# Patient Record
Sex: Male | Born: 1995
Health system: Southern US, Community
[De-identification: ages and names within clinical notes are randomized; demographics above are authoritative.]

## PROBLEM LIST (undated history)

## (undated) DIAGNOSIS — G245 Blepharospasm: Secondary | ICD-10-CM

## (undated) DIAGNOSIS — E213 Hyperparathyroidism, unspecified: Secondary | ICD-10-CM

## (undated) DIAGNOSIS — R519 Headache, unspecified: Secondary | ICD-10-CM

## (undated) DIAGNOSIS — Z87442 Personal history of urinary calculi: Secondary | ICD-10-CM

## (undated) DIAGNOSIS — J45909 Unspecified asthma, uncomplicated: Secondary | ICD-10-CM

## (undated) DIAGNOSIS — R51 Headache: Secondary | ICD-10-CM

## (undated) HISTORY — DX: Blepharospasm: G24.5

## (undated) HISTORY — PX: OTHER SURGICAL HISTORY: SHX169

## (undated) HISTORY — PX: FRACTURE SURGERY: SHX138

---
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
## 2013-10-17 ENCOUNTER — Encounter: Payer: Self-pay | Admitting: Neurology

## 2013-10-17 ENCOUNTER — Ambulatory Visit (INDEPENDENT_AMBULATORY_CARE_PROVIDER_SITE_OTHER): Payer: 59 | Admitting: Neurology

## 2013-10-17 ENCOUNTER — Encounter (INDEPENDENT_AMBULATORY_CARE_PROVIDER_SITE_OTHER): Payer: Self-pay

## 2013-10-17 VITALS — BP 126/61 | HR 68 | Ht 71.0 in | Wt 184.0 lb

## 2013-10-17 DIAGNOSIS — R251 Tremor, unspecified: Secondary | ICD-10-CM

## 2013-10-17 DIAGNOSIS — R259 Unspecified abnormal involuntary movements: Secondary | ICD-10-CM
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
## 2013-10-18 ENCOUNTER — Telehealth: Payer: Self-pay

## 2013-10-18 LAB — COMPREHENSIVE METABOLIC PANEL
ALBUMIN: 4.4 g/dL (ref 3.5–5.5)
ALK PHOS: 87 IU/L (ref 39–117)
ALT: 34 IU/L (ref 0–44)
AST: 15 IU/L (ref 0–40)
Albumin/Globulin Ratio: 1.4 (ref 1.1–2.5)
BUN / CREAT RATIO: 18 (ref 8–19)
BUN: 13 mg/dL (ref 6–20)
CHLORIDE: 100 mmol/L (ref 97–108)
CO2: 26 mmol/L (ref 18–29)
Calcium: 10 mg/dL (ref 8.7–10.2)
Creatinine, Ser: 0.74 mg/dL — ABNORMAL LOW (ref 0.76–1.27)
GFR calc non Af Amer: 133 mL/min/{1.73_m2} (ref >59)
GFR, EST AFRICAN AMERICAN: 154 mL/min/{1.73_m2} (ref >59)
GLUCOSE: 84 mg/dL (ref 65–99)
Globulin, Total: 3.1 g/dL (ref 1.5–4.5)
POTASSIUM: 4.6 mmol/L (ref 3.5–5.2)
Sodium: 139 mmol/L (ref 134–144)
TOTAL PROTEIN: 7.5 g/dL (ref 6.0–8.5)

## 2013-10-18 LAB — CBC WITH DIFFERENTIAL
BASOS ABS: 0 10*3/uL (ref 0.0–0.2)
Basos: 0 %
EOS ABS: 0.5 10*3/uL — AB (ref 0.0–0.4)
Eos: 7 %
HCT: 48 % (ref 37.5–51.0)
Hemoglobin: 15.6 g/dL (ref 12.6–17.7)
IMMATURE GRANULOCYTES: 1 %
Immature Grans (Abs): 0.1 10*3/uL (ref 0.0–0.1)
LYMPHS: 24 %
Lymphocytes Absolute: 1.6 10*3/uL (ref 0.7–3.1)
MCH: 26.6 pg (ref 26.6–33.0)
MCHC: 32.5 g/dL (ref 31.5–35.7)
MCV: 82 fL (ref 79–97)
MONOCYTES: 8 %
Monocytes Absolute: 0.5 10*3/uL (ref 0.1–0.9)
Neutrophils Absolute: 4 10*3/uL (ref 1.4–7.0)
Neutrophils Relative %: 60 %
PLATELETS: 317 10*3/uL (ref 150–379)
RBC: 5.86 x10E6/uL — ABNORMAL HIGH (ref 4.14–5.80)
RDW: 14.2 % (ref 12.3–15.4)
WBC: 6.7 10*3/uL (ref 3.4–10.8)

## 2013-10-18 LAB — VITAMIN B12: VITAMIN B 12: 415 pg/mL (ref 211–946)

## 2013-10-18 LAB — THYROID PANEL WITH TSH
Free Thyroxine Index: 2.4 (ref 1.2–4.9)
T3 Uptake Ratio: 30 % (ref 24–39)
T4 TOTAL: 8.1 ug/dL (ref 4.5–12.0)
TSH: 1.6 u[IU]/mL (ref 0.450–4.500)

## 2013-10-18 LAB — RPR: SYPHILIS RPR SCR: NONREACTIVE

## 2013-10-18 NOTE — Progress Notes (Signed)
Quick Note:  Called and left message for patient normals labs. ______

## 2013-10-18 NOTE — Telephone Encounter (Signed)
Called and left patient a message normal labs. 

## 2013-10-24 ENCOUNTER — Ambulatory Visit (INDEPENDENT_AMBULATORY_CARE_PROVIDER_SITE_OTHER): Payer: 59

## 2013-10-24 ENCOUNTER — Other Ambulatory Visit: Payer: 59

## 2013-10-24 DIAGNOSIS — R259 Unspecified abnormal involuntary movements: Secondary | ICD-10-CM

## 2013-10-24 DIAGNOSIS — R251 Tremor, unspecified: Secondary | ICD-10-CM

## 2013-10-26 NOTE — Progress Notes (Signed)
Quick Note:  Called and left message for patient normal MRI ______

## 2013-11-13 ENCOUNTER — Telehealth: Payer: Self-pay | Admitting: Neurology

## 2013-11-13 NOTE — Telephone Encounter (Signed)
Patient's father calling for MRI results.  Please call and advise.

## 2013-11-13 NOTE — Telephone Encounter (Signed)
Samuel Blankenship: Please call patient, MRI of the brain is normal

## 2013-11-14 NOTE — Telephone Encounter (Signed)
Called and left message told patient normal stated if he has any questions or concerns please give us a call.

## 2013-12-19 ENCOUNTER — Telehealth: Payer: Self-pay | Admitting: *Deleted

## 2013-12-19 NOTE — Telephone Encounter (Signed)
Patient requested copy of Mri Disc of brain and report at front desk 12-19-13.

## 2015-10-04 DIAGNOSIS — S02102A Fracture of base of skull, left side, initial encounter for closed fracture: Secondary | ICD-10-CM | POA: Diagnosis not present

## 2015-10-04 DIAGNOSIS — R109 Unspecified abdominal pain: Secondary | ICD-10-CM | POA: Diagnosis not present

## 2015-10-04 DIAGNOSIS — R079 Chest pain, unspecified: Secondary | ICD-10-CM | POA: Diagnosis not present

## 2015-10-04 DIAGNOSIS — S069X9A Unspecified intracranial injury with loss of consciousness of unspecified duration, initial encounter: Secondary | ICD-10-CM | POA: Diagnosis not present

## 2015-10-04 DIAGNOSIS — S020XXA Fracture of vault of skull, initial encounter for closed fracture: Secondary | ICD-10-CM | POA: Diagnosis not present

## 2015-10-04 DIAGNOSIS — Z043 Encounter for examination and observation following other accident: Secondary | ICD-10-CM | POA: Diagnosis not present

## 2015-10-04 DIAGNOSIS — Z888 Allergy status to other drugs, medicaments and biological substances status: Secondary | ICD-10-CM | POA: Diagnosis not present

## 2015-10-04 DIAGNOSIS — R51 Headache: Secondary | ICD-10-CM | POA: Diagnosis not present

## 2015-10-04 DIAGNOSIS — M25512 Pain in left shoulder: Secondary | ICD-10-CM | POA: Diagnosis not present

## 2015-10-04 DIAGNOSIS — Z880 Allergy status to penicillin: Secondary | ICD-10-CM | POA: Diagnosis not present

## 2015-10-04 DIAGNOSIS — S0219XA Other fracture of base of skull, initial encounter for closed fracture: Secondary | ICD-10-CM | POA: Diagnosis not present

## 2015-10-04 DIAGNOSIS — S51811A Laceration without foreign body of right forearm, initial encounter: Secondary | ICD-10-CM | POA: Diagnosis not present

## 2015-10-04 DIAGNOSIS — F17290 Nicotine dependence, other tobacco product, uncomplicated: Secondary | ICD-10-CM | POA: Diagnosis not present

## 2015-10-04 DIAGNOSIS — S50311A Abrasion of right elbow, initial encounter: Secondary | ICD-10-CM | POA: Diagnosis not present

## 2015-10-04 DIAGNOSIS — S065X0A Traumatic subdural hemorrhage without loss of consciousness, initial encounter: Secondary | ICD-10-CM | POA: Diagnosis not present

## 2015-10-04 DIAGNOSIS — I6200 Nontraumatic subdural hemorrhage, unspecified: Secondary | ICD-10-CM | POA: Diagnosis not present

## 2015-10-04 DIAGNOSIS — X58XXXA Exposure to other specified factors, initial encounter: Secondary | ICD-10-CM | POA: Diagnosis not present

## 2015-10-04 DIAGNOSIS — S82201A Unspecified fracture of shaft of right tibia, initial encounter for closed fracture: Secondary | ICD-10-CM | POA: Diagnosis not present

## 2015-10-04 DIAGNOSIS — S0081XA Abrasion of other part of head, initial encounter: Secondary | ICD-10-CM | POA: Diagnosis not present

## 2015-10-04 DIAGNOSIS — S82231A Displaced oblique fracture of shaft of right tibia, initial encounter for closed fracture: Secondary | ICD-10-CM | POA: Diagnosis not present

## 2015-10-04 DIAGNOSIS — S40211A Abrasion of right shoulder, initial encounter: Secondary | ICD-10-CM | POA: Diagnosis not present

## 2015-10-04 DIAGNOSIS — S82191A Other fracture of upper end of right tibia, initial encounter for closed fracture: Secondary | ICD-10-CM | POA: Diagnosis not present

## 2015-10-14 ENCOUNTER — Encounter: Payer: Self-pay | Admitting: Podiatry

## 2015-10-14 ENCOUNTER — Ambulatory Visit (HOSPITAL_BASED_OUTPATIENT_CLINIC_OR_DEPARTMENT_OTHER)
Admission: RE | Admit: 2015-10-14 | Discharge: 2015-10-14 | Disposition: A | Discharge status: Home / Self Care | Payer: 59 | Source: Ambulatory Visit | Attending: Podiatry | Admitting: Podiatry

## 2015-10-14 ENCOUNTER — Ambulatory Visit (INDEPENDENT_AMBULATORY_CARE_PROVIDER_SITE_OTHER): Payer: 59 | Admitting: Podiatry

## 2015-10-14 DIAGNOSIS — M779 Enthesopathy, unspecified: Secondary | ICD-10-CM

## 2015-10-14 DIAGNOSIS — R609 Edema, unspecified: Secondary | ICD-10-CM

## 2015-10-14 DIAGNOSIS — R52 Pain, unspecified: Secondary | ICD-10-CM | POA: Diagnosis not present
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
## 2015-10-21 DIAGNOSIS — S065X0A Traumatic subdural hemorrhage without loss of consciousness, initial encounter: Secondary | ICD-10-CM | POA: Diagnosis not present

## 2015-10-24 ENCOUNTER — Ambulatory Visit (INDEPENDENT_AMBULATORY_CARE_PROVIDER_SITE_OTHER): Payer: 59 | Admitting: Podiatry

## 2015-10-24 ENCOUNTER — Encounter: Payer: Self-pay | Admitting: Podiatry

## 2015-10-24 DIAGNOSIS — R609 Edema, unspecified: Secondary | ICD-10-CM | POA: Diagnosis not present

## 2015-10-27 DIAGNOSIS — M79661 Pain in right lower leg: Secondary | ICD-10-CM | POA: Diagnosis not present

## 2015-10-27 DIAGNOSIS — M25521 Pain in right elbow: Secondary | ICD-10-CM | POA: Diagnosis not present

## 2015-10-27 MED FILL — HYDROCODON-APAP 5-325: 5-325 | 13 days supply | Qty: 40 | Fill #0

## 2015-10-28 ENCOUNTER — Ambulatory Visit: Payer: 59 | Attending: Orthopedic Surgery | Admitting: Physical Therapy

## 2015-10-28 DIAGNOSIS — R600 Localized edema: Secondary | ICD-10-CM | POA: Insufficient documentation

## 2015-10-28 DIAGNOSIS — R262 Difficulty in walking, not elsewhere classified: Secondary | ICD-10-CM | POA: Insufficient documentation

## 2015-10-28 DIAGNOSIS — M25671 Stiffness of right ankle, not elsewhere classified: Secondary | ICD-10-CM | POA: Insufficient documentation

## 2015-10-28 DIAGNOSIS — M6281 Muscle weakness (generalized): Secondary | ICD-10-CM | POA: Diagnosis not present

## 2015-10-28 DIAGNOSIS — M25661 Stiffness of right knee, not elsewhere classified: Secondary | ICD-10-CM | POA: Insufficient documentation
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
## 2015-10-29 ENCOUNTER — Ambulatory Visit: Payer: 59 | Admitting: Physical Therapy

## 2015-10-29 DIAGNOSIS — R600 Localized edema: Secondary | ICD-10-CM

## 2015-10-29 DIAGNOSIS — M25661 Stiffness of right knee, not elsewhere classified: Secondary | ICD-10-CM

## 2015-10-29 DIAGNOSIS — M6281 Muscle weakness (generalized): Secondary | ICD-10-CM

## 2015-10-29 DIAGNOSIS — M25671 Stiffness of right ankle, not elsewhere classified: Secondary | ICD-10-CM | POA: Diagnosis not present

## 2015-10-29 DIAGNOSIS — R262 Difficulty in walking, not elsewhere classified: Secondary | ICD-10-CM

## 2015-10-29 NOTE — Therapy (Signed)
[facility] [address] [place], [place], [address] Phone: [phone]   Fax:  [phone]  Physical Therapy Treatment  Patient Details  Name: Samuel Blankenship MRN: [id number] Date of Birth: 02-08-1995 Referring Provider: Eulah Pont   Encounter Date: 10/29/2015      PT End of Session - 10/29/15 1820    Visit Number 2   Number of Visits 24   Date for PT Re-Evaluation 12/23/15   PT Start Time 1503   PT Stop Time 1547   PT Time Calculation (min) 44 min   Activity Tolerance Patient tolerated treatment well   Behavior During Therapy [facility] for tasks assessed/performed      Past Medical History:  Diagnosis Date  . Asthma   . Eye twitch     Past Surgical History:  Procedure Laterality Date  . None      There were no vitals filed for this visit.      Subjective Assessment - 10/29/15 1522    Subjective 1-2/10 pain with walking   Currently in Pain? Yes   Pain Score 0-No pain   Pain Location Ankle   Pain Orientation Right   Pain Descriptors / Indicators Sore   Pain Frequency Intermittent   Aggravating Factors  weightbearing   Pain Relieving Factors rest   Multiple Pain Sites Yes   Pain Score 2   Pain Location Knee   Pain Orientation Right;Posterior;Anterior   Pain Descriptors / Indicators Aching;Sharp;Stabbing   Pain Frequency Intermittent   Aggravating Factors  weightbearing longer, stretches   Pain Relieving Factors Tylenol, vicoden, heat            [facility] PT Assessment - 10/29/15 0001      AROM   Right Knee Extension -7   Right Knee Flexion 110                     [facility] Adult PT Treatment/Exercise - 10/29/15 0001      Ambulation/Gait   Ambulation/Gait Yes   Assistive device --  1 crutch   Ambulation Surface Level;Indoor  cues   Gait Comments Patient will try to borrow a crutch from a friend. Cues to aviod wet floors.  Not ready for full time crutch yet.  use walker .     Knee/Hip  Exercises: Stretches   Gastroc Stretch 3 reps;30 seconds  1 leg incline board   Other Knee/Hip Stretches prostretch 10 X 5 second holds sitting     Knee/Hip Exercises: Seated   Long Arc Quad 1 set;10 reps   Heel Slides 1 set;10 reps  cues, foot on pillowcase     Knee/Hip Exercises: Supine   Short Arc Quad Sets 1 set;10 reps   Heel Slides 1 set;10 reps   Straight Leg Raises 1 set;10 reps  cues     Manual Therapy   Manual therapy comments PROM ankle, toes,  AA ROM knee     Ankle Exercises: Supine   Isometrics 3 way 5 X 5 sesonds   Other Supine Ankle Exercises PF/DF AROM                PT Education - 10/29/15 1820    Education provided Yes   Education Details gait   Person(s) Educated Patient   Methods Explanation;Tactile cues;Verbal cues;Demonstration   Comprehension Verbalized understanding;Returned demonstration          PT Short Term Goals - 10/28/15 1416      PT SHORT TERM GOAL #1  Title Pt will be I with initial HEP for Rt. LE    Time 4   Period Weeks   Status New     PT SHORT TERM GOAL #2   Title Pt will be able to walk in community with 1 crutch and min increase in pain.    Time 4   Period Weeks   Status New     PT SHORT TERM GOAL #3   Title Pt will complete FOTO and set goal.    Time 4   Period Weeks   Status New     PT SHORT TERM GOAL #4   Title Pt will increase AROM in Rt. knee flexion to 115 deg    Time 4   Period Weeks   Status New     PT SHORT TERM GOAL #5   Title Pt will demo R ankle strength to 4/5 throughout or greater to improve gait stability   Time 4   Period Weeks   Status New           PT Long Term Goals - 10/28/15 1418      PT LONG TERM GOAL #1   Title Pt will be I with concepts of RICE, more advanced LE HEP.    Time 8   Period Weeks     PT LONG TERM GOAL #2   Title Pt will demo Rt. knee AROM to -3 to 120 deg or better for transfers and normalized gait.    Time 8   Period Weeks   Status New     PT LONG  TERM GOAL #3   Title Pt will demo 4+/5 strength in Rt. LE in major muscle groups for normalized gait.    Time 8   Period Weeks   Status New     PT LONG TERM GOAL #4   Title Pt will walk as needed in the community without pain, min limp and without device.    Time 8   Period Weeks   Status New     PT LONG TERM GOAL #5   Title FOTO score will be set once complete.    Time 8   Period Weeks   Status New               Plan - 10/29/15 1820    Clinical Impression Statement Patient was able to demo safe gait with 1 crutch.  Exercises for ROM and strengthening knee/ankle continued.  Knee ROM improving 110 degrees.  -7 extension.   PT Next Visit Plan gait , ROM  ,  standing step stretches for ankle and knee.     PT Home Exercise Plan continue   Consulted and Agree with Plan of Care Patient      Patient will benefit from skilled therapeutic intervention in order to improve the following deficits and impairments:  Abnormal gait, Decreased range of motion, Difficulty walking, Increased fascial restricitons, Decreased activity tolerance, Pain, Impaired flexibility, Decreased scar mobility, Decreased balance, Decreased mobility, Decreased strength, Increased edema, Impaired sensation, Postural dysfunction  Visit Diagnosis: Difficulty in walking, not elsewhere classified  Localized edema  Stiffness of right knee, not elsewhere classified  Stiffness of right ankle, not elsewhere classified  Muscle weakness (generalized)     Problem List Patient Active Problem List   Diagnosis Date Noted  . Tremor 10/17/2013    Gelila Well  PTA 10/29/2015, 6:24 PM  [facility] [address] [place], [place], [address] Phone: [phone]  Fax:  [phone]  Name: Samuel Blankenship MRN: [id number] Date of Birth: 02-03-1995

## 2015-10-30 ENCOUNTER — Ambulatory Visit: Payer: 59 | Admitting: Physical Therapy

## 2015-10-30 DIAGNOSIS — M6281 Muscle weakness (generalized): Secondary | ICD-10-CM | POA: Diagnosis not present

## 2015-10-30 DIAGNOSIS — M25661 Stiffness of right knee, not elsewhere classified: Secondary | ICD-10-CM

## 2015-10-30 DIAGNOSIS — R262 Difficulty in walking, not elsewhere classified: Secondary | ICD-10-CM

## 2015-10-30 DIAGNOSIS — R600 Localized edema: Secondary | ICD-10-CM

## 2015-10-30 DIAGNOSIS — M25671 Stiffness of right ankle, not elsewhere classified: Secondary | ICD-10-CM

## 2015-10-30 NOTE — Therapy (Signed)
[facility] [address] [place], [place], [address] Phone: [phone]   Fax:  [phone]  Physical Therapy Treatment  Patient Details  Name: Samuel Blankenship MRN: [id number] Date of Birth: [birth year] Referring Provider: Eulah PontMurphy   Encounter Date: 10/30/2015      PT End of Session - 10/30/15 1627    Visit Number 3   Number of Visits 24   Date for PT Re-Evaluation 12/23/15   PT Start Time 0345   PT Stop Time 0450   PT Time Calculation (min) 65 min      Past Medical History:  Diagnosis Date  . Asthma   . Eye twitch     Past Surgical History:  Procedure Laterality Date  . None      There were no vitals filed for this visit.      Subjective Assessment - 10/30/15 1549    Subjective More soreness around the knee.    Currently in Pain? Yes   Pain Score 2    Pain Location Knee            [facility] PT Assessment - 10/30/15 0001      AROM   Right Knee Flexion 115                     [facility] Adult PT Treatment/Exercise - 10/30/15 0001      Knee/Hip Exercises: Aerobic   Recumbent Bike 5 minutes full revolutions and level 1      Knee/Hip Exercises: Standing   Other Standing Knee Exercises standing step stretch for knee and ankle 3 x 30 sec      Knee/Hip Exercises: Seated   Long Arc Quad 20 reps     Knee/Hip Exercises: Supine   Short Arc Quad Sets 20 reps   Heel Slides 20 reps   Bridges 10 reps   Bridges Limitations also 10 on ball   Straight Leg Raises 15 reps     Knee/Hip Exercises: Sidelying   Hip ABduction 15 reps     Knee/Hip Exercises: Prone   Hamstring Curl Limitations 1 rep very difficult    Hip Extension Limitations unable      Modalities   Modalities Cryotherapy     Cryotherapy   Number Minutes Cryotherapy 10 Minutes   Cryotherapy Location Knee   Type of Cryotherapy Ice pack     Ankle Exercises: Seated   Heel Raises 15 reps   Toe Raise 15 reps     Ankle Exercises: Supine    T-Band red band seated inversion and eversion                 PT Education - 10/30/15 1637    Education provided Yes   Education Details Ankle 4 way red band   Person(s) Educated Patient   Methods Explanation;Handout   Comprehension Verbalized understanding          PT Short Term Goals - 10/28/15 1416      PT SHORT TERM GOAL #1   Title Pt will be I with initial HEP for Rt. LE    Time 4   Period Weeks   Status New     PT SHORT TERM GOAL #2   Title Pt will be able to walk in community with 1 crutch and min increase in pain.    Time 4   Period Weeks   Status New     PT SHORT TERM GOAL #3   Title Pt will complete  FOTO and set goal.    Time 4   Period Weeks   Status New     PT SHORT TERM GOAL #4   Title Pt will increase AROM in Rt. knee flexion to 115 deg    Time 4   Period Weeks   Status New     PT SHORT TERM GOAL #5   Title Pt will demo R ankle strength to 4/5 throughout or greater to improve gait stability   Time 4   Period Weeks   Status New           PT Long Term Goals - 10/28/15 1418      PT LONG TERM GOAL #1   Title Pt will be I with concepts of RICE, more advanced LE HEP.    Time 8   Period Weeks     PT LONG TERM GOAL #2   Title Pt will demo Rt. knee AROM to -3 to 120 deg or better for transfers and normalized gait.    Time 8   Period Weeks   Status New     PT LONG TERM GOAL #3   Title Pt will demo 4+/5 strength in Rt. LE in major muscle groups for normalized gait.    Time 8   Period Weeks   Status New     PT LONG TERM GOAL #4   Title Pt will walk as needed in the community without pain, min limp and without device.    Time 8   Period Weeks   Status New     PT LONG TERM GOAL #5   Title FOTO score will be set once complete.    Time 8   Period Weeks   Status New               Plan - 10/30/15 1638    Clinical Impression Statement Improving ROM, increased soreness with transition to 1 crutch. Recommended more ice  throughout the day.    PT Next Visit Plan gait , ROM  , standing step stretches for ankle and knee.  REVIEW 4 way ANKLE, bike      Patient will benefit from skilled therapeutic intervention in order to improve the following deficits and impairments:  Abnormal gait, Decreased range of motion, Difficulty walking, Increased fascial restricitons, Decreased activity tolerance, Pain, Impaired flexibility, Decreased scar mobility, Decreased balance, Decreased mobility, Decreased strength, Increased edema, Impaired sensation, Postural dysfunction  Visit Diagnosis: Difficulty in walking, not elsewhere classified  Localized edema  Stiffness of right knee, not elsewhere classified  Stiffness of right ankle, not elsewhere classified  Muscle weakness (generalized)     Problem List Patient Active Problem List   Diagnosis Date Noted  . Tremor 10/17/2013    Samuel Blankenship , PTA [facility] [address] [place], [place], [address] Phone: [phone]   Fax:  [phone]  Name: Samuel Blankenship MRN: [id number] Date of Birth: January 20, 1996
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
## 2015-11-03 ENCOUNTER — Ambulatory Visit: Payer: 59 | Admitting: Physical Therapy

## 2015-11-05 ENCOUNTER — Ambulatory Visit: Payer: 59 | Attending: Orthopedic Surgery | Admitting: Physical Therapy

## 2015-11-05 DIAGNOSIS — M25671 Stiffness of right ankle, not elsewhere classified: Secondary | ICD-10-CM | POA: Insufficient documentation

## 2015-11-05 DIAGNOSIS — R600 Localized edema: Secondary | ICD-10-CM | POA: Diagnosis not present

## 2015-11-05 DIAGNOSIS — M25661 Stiffness of right knee, not elsewhere classified: Secondary | ICD-10-CM | POA: Insufficient documentation

## 2015-11-05 DIAGNOSIS — R262 Difficulty in walking, not elsewhere classified: Secondary | ICD-10-CM | POA: Diagnosis not present

## 2015-11-05 DIAGNOSIS — M6281 Muscle weakness (generalized): Secondary | ICD-10-CM | POA: Diagnosis not present

## 2015-11-05 NOTE — Therapy (Signed)
[facility] [address] [place], [place], [address] Phone: [phone]   Fax:  [phone]  Physical Therapy Treatment  Patient Details  Name: Samuel Blankenship MRN: [id number] Date of Birth: 1995/12/02 Referring Provider: Eulah PontMurphy   Encounter Date: 11/05/2015      PT End of Session - 11/05/15 1527    Visit Number 4   Number of Visits 24   Date for PT Re-Evaluation 12/23/15   PT Start Time 1442   PT Stop Time 1536   PT Time Calculation (min) 54 min   Activity Tolerance Patient tolerated treatment well   Behavior During Therapy [facility] for tasks assessed/performed      Past Medical History:  Diagnosis Date  . Asthma   . Eye twitch     Past Surgical History:  Procedure Laterality Date  . None      There were no vitals filed for this visit.      Subjective Assessment - 11/05/15 1444    Subjective Had some shin pain earlier, mild in patella.  Rated 2/10. Wants to work on hamstring strength.     Currently in Pain? Yes   Pain Score 2    Pain Location Knee   Pain Orientation Right   Pain Descriptors / Indicators Sore   Pain Type Surgical pain   Pain Onset More than a month ago   Pain Frequency Intermittent            [facility] PT Assessment - 11/05/15 1524      AROM   Right Knee Flexion 124  supine              [facility] Adult PT Treatment/Exercise - 11/05/15 1446      Knee/Hip Exercises: Stretches   Active Hamstring Stretch Right;3 reps;30 seconds   Knee: Self-Stretch to increase Flexion Right;3 reps;30 seconds     Knee/Hip Exercises: Aerobic   Recumbent Bike 6 min level 3      Knee/Hip Exercises: Standing   Hip Extension Stengthening;Right;1 set;10 reps   Other Standing Knee Exercises hamstring curl standing Rt. LE x 10      Knee/Hip Exercises: Supine   Quad Sets Strengthening;Right;1 set;20 reps;Other (comment)   Quad Sets Limitations prone quad set  towel under thigh    Hip Adduction Isometric  Strengthening;Both;1 set   Bridges Limitations bridge with knee ext x 10 Rt. LE    Bridges with Beacher MayBall Squeeze Strengthening;Both;1 set;10 reps   Straight Leg Raises Strengthening;Right;1 set;10 reps   Straight Leg Raise with External Rotation Strengthening;Right;1 set;10 reps   Straight Leg Raise with External Rotation Limitations up on elbows      Knee/Hip Exercises: Prone   Hamstring Curl 2 sets;10 reps   Hip Extension Strengthening;Right;1 set;10 reps     Ankle Exercises: Stretches   Slant Board Stretch 3 reps;30 seconds  2 times knee ext, 2 times soleus, sl knee flex.                 PT Education - 11/05/15 1527    Education provided Yes   Education Details progress   Person(s) Educated Patient   Methods Explanation;Demonstration   Comprehension Verbalized understanding;Returned demonstration          PT Short Term Goals - 11/05/15 1514      PT SHORT TERM GOAL #1   Title Pt will be I with initial HEP for Rt. LE    Status Achieved     PT SHORT TERM GOAL #2  Title Pt will be able to walk in community with 1 crutch and min increase in pain.    Status Achieved     PT SHORT TERM GOAL #3   Title Pt will complete FOTO and set goal.    Status Achieved     PT SHORT TERM GOAL #4   Title Pt will increase AROM in Rt. knee flexion to 115 deg    Status Achieved     PT SHORT TERM GOAL #5   Title Pt will demo R ankle strength to 4/5 throughout or greater to improve gait stability   Status On-going           PT Long Term Goals - 11/05/15 1515      PT LONG TERM GOAL #1   Title Pt will be I with concepts of RICE, more advanced LE HEP.    Status On-going     PT LONG TERM GOAL #2   Title Pt will demo Rt. knee AROM to -3 to 120 deg or better for transfers and normalized gait.    Status On-going     PT LONG TERM GOAL #3   Title Pt will demo 4+/5 strength in Rt. LE in major muscle groups for normalized gait.    Status Unable to assess     PT LONG TERM GOAL #4    Title Pt will walk as needed in the community without pain, min limp and without device.    Status On-going     PT LONG TERM GOAL #5   Title FOTO score will be 37% impaired or less to demo functional improvement. .     Status On-going               Plan - 11/05/15 1529    Clinical Impression Statement Pt still improving gait and AROM.  Pt did not wear the ace wrap to see if he can go without.  Sees Neurologist for SDH follow up next week.  I with HEP.    PT Next Visit Plan cont strength of Rt. LE, progress ROM.  slowly work into standing ex as tolerated. Bike, ice    PT Home Exercise Plan I with Initial  HEP    Consulted and Agree with Plan of Care Patient      Patient will benefit from skilled therapeutic intervention in order to improve the following deficits and impairments:  Abnormal gait, Decreased range of motion, Difficulty walking, Increased fascial restricitons, Decreased activity tolerance, Pain, Impaired flexibility, Decreased scar mobility, Decreased balance, Decreased mobility, Decreased strength, Increased edema, Impaired sensation, Postural dysfunction  Visit Diagnosis: Difficulty in walking, not elsewhere classified  Localized edema  Stiffness of right knee, not elsewhere classified  Stiffness of right ankle, not elsewhere classified  Muscle weakness (generalized)     Problem List Patient Active Problem List   Diagnosis Date Noted  . Tremor 10/17/2013    Ronda Kazmi 11/05/2015, 3:33 PM  [facility] [address] [place], [place], [address] Phone: [phone]   Fax:  [phone]  Name: Samuel Blankenship MRN: [id number] Date of Birth: Oct 14, 1995  Karie Mainland, PT 11/05/15 3:35 PM Phone: [phone] Fax: [phone]

## 2015-11-07 ENCOUNTER — Ambulatory Visit: Payer: 59 | Admitting: Physical Therapy

## 2015-11-07 DIAGNOSIS — M25661 Stiffness of right knee, not elsewhere classified: Secondary | ICD-10-CM | POA: Diagnosis not present

## 2015-11-07 DIAGNOSIS — R262 Difficulty in walking, not elsewhere classified: Secondary | ICD-10-CM

## 2015-11-07 DIAGNOSIS — M25671 Stiffness of right ankle, not elsewhere classified: Secondary | ICD-10-CM | POA: Diagnosis not present

## 2015-11-07 DIAGNOSIS — R600 Localized edema: Secondary | ICD-10-CM | POA: Diagnosis not present

## 2015-11-07 DIAGNOSIS — M6281 Muscle weakness (generalized): Secondary | ICD-10-CM

## 2015-11-07 DIAGNOSIS — S82231A Displaced oblique fracture of shaft of right tibia, initial encounter for closed fracture: Secondary | ICD-10-CM | POA: Diagnosis not present

## 2015-11-07 NOTE — Patient Instructions (Addendum)
Bracing With Bridging (Hook-Lying)    With neutral spine, tighten pelvic floor and abdominals and hold. Lift bottom, PUSH HEELS INTO THE FLOOR. Repeat _10__ times. Do _2__ times a day.   Copyright  VHI. All rights reserved.  Back Wall Slide    With feet __10-12__ inches from wall, lean as much of back against the wall as possible. Gently squat down _1/4 to 1/3 of the way __ inches, keeping back against wall. Hold __5-10__ seconds while counting out loud. Repeat _10___ times. Do __1-2__ sessions per day.  http://gt2.exer.us/563   Copyright  VHI. All rights reserved.    Heel Raise: Bilateral (Standing)    Rise on balls of feet. Repeat __20__ times per set. Do _1___ sets per session. Do __1-2_ sessions per day.  http://orth.exer.us/38   Copyright  VHI. All rights reserved.

## 2015-11-07 NOTE — Therapy (Signed)
[facility] [address] [place], [place], [address] Phone: [phone]   Fax:  [phone]  Physical Therapy Treatment  Patient Details  Name: Samuel LeiterDavid S Dykes MRN: [id number] Date of Birth: 11/15/1995 Referring Provider: Eulah PontMurphy   Encounter Date: 11/07/2015      PT End of Session - 11/07/15 1015    Visit Number 5   Number of Visits 24   Date for PT Re-Evaluation 12/23/15   PT Start Time 0936   PT Stop Time 1024   PT Time Calculation (min) 48 min   Activity Tolerance Patient tolerated treatment well   Behavior During Therapy [facility] for tasks assessed/performed      Past Medical History:  Diagnosis Date  . Asthma   . Eye twitch     Past Surgical History:  Procedure Laterality Date  . None      There were no vitals filed for this visit.      Subjective Assessment - 11/07/15 0943    Subjective No new complaints.  Stiff today, but no pain, really.    Currently in Pain? No/denies            [facility] - 11/07/15 0948      Knee/Hip Exercises: Aerobic   Recumbent Bike 6 min level 3      Knee/Hip Exercises: Standing   Heel Raises Left;1 set;10 reps   Heel Raises Limitations light U assist    Forward Lunges Left;1 set   Forward Lunges Limitations 10 sec hold x 10 in parallel bars    Abduction Limitations semicircle towel slides    Forward Step Up Left;1 set;Hand Hold: 2;Step Height: 4"   Forward Step Up Limitations has to "hop", decreased strength    SLS with Vectors semicircles x 10 each leg    Other Standing Knee Exercises gait in parallel bars with occasional UE assist      Knee/Hip Exercises: Seated   Long Arc Quad Strengthening;Right;1 set;20 reps;Weights   Long Arc Quad Weight 3 lbs.     Knee/Hip Exercises: Supine   Quad Sets Strengthening;Right;1 set;20 reps;Other (comment)   Heel Slides 20 reps   Heel Slides Limitations changed to hamstring set    Bridges  Strengthening;Both;1 set;10 reps   Bridges Limitations ball squeeze     Knee/Hip Exercises: Sidelying   Hip ABduction 15 reps   Clams 15x     Modalities   Modalities Cryotherapy     Cryotherapy   Number Minutes Cryotherapy 10 Minutes   Cryotherapy Location Knee   Type of Cryotherapy Ice pack     Ankle Exercises: Supine   T-Band red band seated inversion and eversion      took ice off 2 min early due to stinging.            PT Education - 11/07/15 1015    Education provided Yes   Education Details closed chain vs open    Person(s) Educated Patient   Methods Explanation   Comprehension Verbalized understanding          PT Short Term Goals - 11/05/15 1514      PT SHORT TERM GOAL #1   Title Pt will be I with initial HEP for Rt. LE    Status Achieved     PT SHORT TERM GOAL #2   Title Pt will be able to walk in community with 1 crutch and min increase in pain.    Status Achieved     PT  SHORT TERM GOAL #3   Title Pt will complete FOTO and set goal.    Status Achieved     PT SHORT TERM GOAL #4   Title Pt will increase AROM in Rt. knee flexion to 115 deg    Status Achieved     PT SHORT TERM GOAL #5   Title Pt will demo R ankle strength to 4/5 throughout or greater to improve gait stability   Status On-going           PT Long Term Goals - 11/05/15 1515      PT LONG TERM GOAL #1   Title Pt will be I with concepts of RICE, more advanced LE HEP.    Status On-going     PT LONG TERM GOAL #2   Title Pt will demo Rt. knee AROM to -3 to 120 deg or better for transfers and normalized gait.    Status On-going     PT LONG TERM GOAL #3   Title Pt will demo 4+/5 strength in Rt. LE in major muscle groups for normalized gait.    Status Unable to assess     PT LONG TERM GOAL #4   Title Pt will walk as needed in the community without pain, min limp and without device.    Status On-going     PT LONG TERM GOAL #5   Title FOTO score will be 37% impaired or less  to demo functional improvement. .     Status On-going               Plan - 11/07/15 1037    Clinical Impression Statement Pt probably ready for cane next week.  Progressing well, pain incr with standing, weakness evident.    PT Next Visit Plan cont strength of Rt. LE, progress ROM.  slowly work into standing ex as tolerated. Bike, ice    PT Home Exercise Plan I with Initial  HEP    Consulted and Agree with Plan of Care Patient      Patient will benefit from skilled therapeutic intervention in order to improve the following deficits and impairments:  Abnormal gait, Decreased range of motion, Difficulty walking, Increased fascial restricitons, Decreased activity tolerance, Pain, Impaired flexibility, Decreased scar mobility, Decreased balance, Decreased mobility, Decreased strength, Increased edema, Impaired sensation, Postural dysfunction  Visit Diagnosis: Difficulty in walking, not elsewhere classified  Localized edema  Stiffness of right knee, not elsewhere classified  Stiffness of right ankle, not elsewhere classified  Muscle weakness (generalized)     Problem List Patient Active Problem List   Diagnosis Date Noted  . Tremor 10/17/2013    Keren Alverio 11/07/2015, 10:58 AM  [facility] [address] [place], [place], [address] Phone: [phone]   Fax:  [phone]  Name: ZAYYAN MULLEN MRN: [id number] Date of Birth: 26-Jun-1995  Karie Mainland, PT 11/07/15 10:58 AM Phone: [phone] Fax: [phone]

## 2015-11-10 ENCOUNTER — Ambulatory Visit: Payer: 59 | Admitting: Physical Therapy

## 2015-11-10 DIAGNOSIS — M25671 Stiffness of right ankle, not elsewhere classified: Secondary | ICD-10-CM | POA: Diagnosis not present

## 2015-11-10 DIAGNOSIS — M25661 Stiffness of right knee, not elsewhere classified: Secondary | ICD-10-CM | POA: Diagnosis not present

## 2015-11-10 DIAGNOSIS — R262 Difficulty in walking, not elsewhere classified: Secondary | ICD-10-CM

## 2015-11-10 DIAGNOSIS — M6281 Muscle weakness (generalized): Secondary | ICD-10-CM | POA: Diagnosis not present

## 2015-11-10 DIAGNOSIS — R600 Localized edema: Secondary | ICD-10-CM

## 2015-11-10 NOTE — Therapy (Signed)
[facility] [address] [place], [place], [address] Phone: [phone]   Fax:  [phone]  Physical Therapy Treatment  Patient Details  Name: Samuel Blankenship MRN: [id number] Date of Birth: 1995/10/22 Referring Provider: Eulah PontMurphy   Encounter Date: 11/10/2015      PT End of Session - 11/10/15 1022    Visit Number 6   Number of Visits 24   Date for PT Re-Evaluation 12/23/15   PT Start Time 1017   PT Stop Time 1100   PT Time Calculation (min) 43 min      Past Medical History:  Diagnosis Date  . Asthma   . Eye twitch     Past Surgical History:  Procedure Laterality Date  . None      There were no vitals filed for this visit.      Subjective Assessment - 11/10/15 1021    Currently in Pain? Yes   Pain Score 2    Pain Location Knee   Pain Orientation Right   Aggravating Factors  walking, after prolonged positions then moving   Pain Relieving Factors rest                         [facility] Adult PT Treatment/Exercise - 11/10/15 0001      Knee/Hip Exercises: Aerobic   Recumbent Bike 5 min level 3      Knee/Hip Exercises: Standing   Heel Raises Left;1 set;10 reps   Heel Raises Limitations light U assist 5 sec holds with weight shifting to the right, c/ o knee popping   Knee Flexion 5 reps   Knee Flexion Limitations painful so disc   Other Standing Knee Exercises gait in parallel bars with occasional UE assist, pregait roll thu heel strike, toe off then gait training with 1 crutch to decrease antalgic gait.      Knee/Hip Exercises: Seated   Long Arc Quad 1 set;10 reps   Long Arc Quad Weight --  yellow band    Hamstring Curl 1 set;10 reps   Hamstring Limitations yellow band, less pain       Knee/Hip Exercises: Supine   Bridges Strengthening;Both;1 set;10 reps   Straight Leg Raises Strengthening;Right;1 set;15 reps                PT Education - 11/10/15 1117    Education provided Yes    Education Details HEP   Person(s) Educated Patient   Methods Explanation;Handout   Comprehension Verbalized understanding          PT Short Term Goals - 11/05/15 1514      PT SHORT TERM GOAL #1   Title Pt will be I with initial HEP for Rt. LE    Status Achieved     PT SHORT TERM GOAL #2   Title Pt will be able to walk in community with 1 crutch and min increase in pain.    Status Achieved     PT SHORT TERM GOAL #3   Title Pt will complete FOTO and set goal.    Status Achieved     PT SHORT TERM GOAL #4   Title Pt will increase AROM in Rt. knee flexion to 115 deg    Status Achieved     PT SHORT TERM GOAL #5   Title Pt will demo R ankle strength to 4/5 throughout or greater to improve gait stability   Status On-going  PT Long Term Goals - 11/05/15 1515      PT LONG TERM GOAL #1   Title Pt will be I with concepts of RICE, more advanced LE HEP.    Status On-going     PT LONG TERM GOAL #2   Title Pt will demo Rt. knee AROM to -3 to 120 deg or better for transfers and normalized gait.    Status On-going     PT LONG TERM GOAL #3   Title Pt will demo 4+/5 strength in Rt. LE in major muscle groups for normalized gait.    Status Unable to assess     PT LONG TERM GOAL #4   Title Pt will walk as needed in the community without pain, min limp and without device.    Status On-going     PT LONG TERM GOAL #5   Title FOTO score will be 37% impaired or less to demo functional improvement. .     Status On-going               Plan - 11/10/15 1059    Clinical Impression Statement Focused decreasing antalgic gait with 1 crutch. Added yellow band LAQ and Hamstring curls to HEP. Hamstring curls painful in standing and prone. HE is able to do sitting with yellow band with mild pain.    PT Next Visit Plan cont strength of Rt. LE, progress ROM.  slowly work into standing ex as tolerated. Bike, ice , TRY CANE   PT Home Exercise Plan hamstring curls and LAQ with  yellow band   Consulted and Agree with Plan of Care Patient      Patient will benefit from skilled therapeutic intervention in order to improve the following deficits and impairments:  Abnormal gait, Decreased range of motion, Difficulty walking, Increased fascial restricitons, Decreased activity tolerance, Pain, Impaired flexibility, Decreased scar mobility, Decreased balance, Decreased mobility, Decreased strength, Increased edema, Impaired sensation, Postural dysfunction  Visit Diagnosis: Difficulty in walking, not elsewhere classified  Localized edema  Stiffness of right knee, not elsewhere classified  Stiffness of right ankle, not elsewhere classified  Muscle weakness (generalized)     Problem List Patient Active Problem List   Diagnosis Date Noted  . Tremor 10/17/2013    Sherrie Mustache, PTA 11/10/2015, 11:20 AM  [facility] [address] [place], [place], [address] Phone: [phone]   Fax:  [phone]  Name: Samuel Blankenship MRN: [id number] Date of Birth: 1995-03-31

## 2015-11-10 NOTE — Patient Instructions (Signed)
Knee Extension: Resisted (Sitting)   With band looped around right ankle and under other foot, straighten leg with ankle loop. Keep other leg bent to increase resistance. Repeat _10___ times per set. Do _2___ sets per session. Do __2__ sessions per day.  http://orth.[email or web address].[email or web address]   CKnee Flexion: Resisted (Sitting)   Sit with band under left foot and looped around ankle of supported leg. Pull unsupported leg back. Repeat __20__ times per set. Do _2___ sets per session. Do __2__ sessions per day.

## 2015-11-12 ENCOUNTER — Ambulatory Visit: Payer: 59 | Admitting: Physical Therapy

## 2015-11-12 DIAGNOSIS — R262 Difficulty in walking, not elsewhere classified: Secondary | ICD-10-CM

## 2015-11-12 DIAGNOSIS — M6281 Muscle weakness (generalized): Secondary | ICD-10-CM | POA: Diagnosis not present

## 2015-11-12 DIAGNOSIS — M25661 Stiffness of right knee, not elsewhere classified: Secondary | ICD-10-CM | POA: Diagnosis not present

## 2015-11-12 DIAGNOSIS — R600 Localized edema: Secondary | ICD-10-CM | POA: Diagnosis not present

## 2015-11-12 DIAGNOSIS — M25671 Stiffness of right ankle, not elsewhere classified: Secondary | ICD-10-CM | POA: Diagnosis not present

## 2015-11-12 NOTE — Therapy (Signed)
[facility] [address] [place], [place], [address] Phone: [phone]   Fax:  [phone]  Physical Therapy Treatment  Patient Details  Name: Samuel Blankenship MRN: [id number] Date of Birth: 1995-05-28 Referring Provider: Eulah Pont   Encounter Date: 11/12/2015      PT End of Session - 11/12/15 1036    Visit Number 7   Number of Visits 24   Date for PT Re-Evaluation 12/23/15   PT Start Time 1016   PT Stop Time 1115   PT Time Calculation (min) 59 min   Activity Tolerance Patient tolerated treatment well   Behavior During Therapy [facility] for tasks assessed/performed      Past Medical History:  Diagnosis Date  . Asthma   . Eye twitch     Past Surgical History:  Procedure Laterality Date  . None      There were no vitals filed for this visit.      Subjective Assessment - 11/12/15 1019    Subjective Pain "twinge" when i am up walking, moving the wrong way.  Walks in with 1 crutch.  No pain at rest.    Currently in Pain? No/denies   Pain Score --  min with walking and moving             [facility] Adult PT Treatment/Exercise - 11/12/15 1020      Ambulation/Gait   Ambulation/Gait Yes   Ambulation/Gait Assistance 6: Modified independent (Device/Increase time)   Ambulation Distance (Feet) 300 Feet   Assistive device Straight cane  1 crutch   Gait Pattern Step-through pattern   Ambulation Surface Level;Indoor     Lumbar Exercises: Machines for Strengthening   Other Lumbar Machine Exercise Pilates Reformer see note      Knee/Hip Exercises: Stretches   Active Hamstring Stretch Right;3 reps;30 seconds   Other Knee/Hip Stretches ITB x 2 x 30 sec LLE      Knee/Hip Exercises: Aerobic   Recumbent Bike level 5 LE only 5 min      Knee/Hip Exercises: Seated   Hamstring Curl 1 set;10 reps   Hamstring Limitations yellow band, less pain       Knee/Hip Exercises: Supine   Bridges Strengthening;Right;1 set;10 reps   Bridges Limitations single leg bridge      Vasopneumatic   Number Minutes Vasopneumatic  15 minutes   Vasopnuematic Location  Knee   Vasopneumatic Pressure Medium   Vasopneumatic Temperature  32 deg        Pilates Reformer used for LE/core strength, postural strength, lumbopelvic disassociation and core control.  Exercises included:  Footwork 2 Red 1 Blue for double leg, single leg work 2 Red for eccentric hamstring focus.   Bridging 4 springs with ball squeeze, x 10   Feet in straps single leg 1 Rd with PT assist, hamstring stretch and arcs.           PT Education - 11/12/15 1238    Education provided Yes   Education Details Pilates Reformer, gait with cane    Person(s) Educated Patient   Methods Explanation;Demonstration;Verbal cues;Tactile cues   Comprehension Verbalized understanding;Need further instruction          PT Short Term Goals - 11/05/15 1514      PT SHORT TERM GOAL #1   Title Pt will be I with initial HEP for Rt. LE    Status Achieved     PT SHORT TERM GOAL #2   Title Pt will be able to walk in  community with 1 crutch and min increase in pain.    Status Achieved     PT SHORT TERM GOAL #3   Title Pt will complete FOTO and set goal.    Status Achieved     PT SHORT TERM GOAL #4   Title Pt will increase AROM in Rt. knee flexion to 115 deg    Status Achieved     PT SHORT TERM GOAL #5   Title Pt will demo R ankle strength to 4/5 throughout or greater to improve gait stability   Status On-going           PT Long Term Goals - 11/05/15 1515      PT LONG TERM GOAL #1   Title Pt will be I with concepts of RICE, more advanced LE HEP.    Status On-going     PT LONG TERM GOAL #2   Title Pt will demo Rt. knee AROM to -3 to 120 deg or better for transfers and normalized gait.    Status On-going     PT LONG TERM GOAL #3   Title Pt will demo 4+/5 strength in Rt. LE in major muscle groups for normalized gait.    Status Unable to assess     PT  LONG TERM GOAL #4   Title Pt will walk as needed in the community without pain, min limp and without device.    Status On-going     PT LONG TERM GOAL #5   Title FOTO score will be 37% impaired or less to demo functional improvement. .     Status On-going               Plan - 11/12/15 1036    Clinical Impression Statement Pt able to walk with cane, good technique, same amt of pain as crutch.  He has a few canes at home that he can use. Worked on Reformer for controlled hamstring activation with pain minimized.     PT Next Visit Plan cont strength of Rt. LE, progress ROM.  slowly work into standing ex as tolerated. Bike, ice   PT Home Exercise Plan hamstring curls and LAQ with yellow band   Consulted and Agree with Plan of Care Patient      Patient will benefit from skilled therapeutic intervention in order to improve the following deficits and impairments:  Abnormal gait, Decreased range of motion, Difficulty walking, Increased fascial restricitons, Decreased activity tolerance, Pain, Impaired flexibility, Decreased scar mobility, Decreased balance, Decreased mobility, Decreased strength, Increased edema, Impaired sensation, Postural dysfunction  Visit Diagnosis: Difficulty in walking, not elsewhere classified  Localized edema  Stiffness of right knee, not elsewhere classified  Stiffness of right ankle, not elsewhere classified  Muscle weakness (generalized)     Problem List Patient Active Problem List   Diagnosis Date Noted  . Tremor 10/17/2013    PAA,JENNIFER 11/12/2015, 12:44 PM  [facility] [address] [place], [place], [address] Phone: [phone]   Fax:  [phone]  Name: Sloan LeiterDavid S Seki MRN: [id number] Date of Birth: 04-Aug-1995  Karie MainlandJennifer Paa, PT 11/12/15 12:44 PM Phone: [phone] Fax: [phone]

## 2015-11-14 ENCOUNTER — Ambulatory Visit: Payer: 59 | Admitting: Physical Therapy

## 2015-11-14 DIAGNOSIS — M25671 Stiffness of right ankle, not elsewhere classified: Secondary | ICD-10-CM | POA: Diagnosis not present

## 2015-11-14 DIAGNOSIS — R262 Difficulty in walking, not elsewhere classified: Secondary | ICD-10-CM | POA: Diagnosis not present

## 2015-11-14 DIAGNOSIS — M25661 Stiffness of right knee, not elsewhere classified: Secondary | ICD-10-CM

## 2015-11-14 DIAGNOSIS — M6281 Muscle weakness (generalized): Secondary | ICD-10-CM | POA: Diagnosis not present

## 2015-11-14 DIAGNOSIS — R600 Localized edema: Secondary | ICD-10-CM

## 2015-11-14 NOTE — Therapy (Signed)
[facility] [address] [place], [place], [address] Phone: [phone]   Fax:  [phone]  Physical Therapy Treatment  Patient Details  Name: Samuel Blankenship MRN: [id number] Date of Birth: 05-15-1995 Referring Provider: Eulah Pont   Encounter Date: 11/14/2015      PT End of Session - 11/14/15 1133    Visit Number 8   Number of Visits 24   Date for PT Re-Evaluation 12/23/15   PT Start Time 1105   PT Stop Time 1200   PT Time Calculation (min) 55 min   Activity Tolerance Patient tolerated treatment well   Behavior During Therapy [facility] for tasks assessed/performed      Past Medical History:  Diagnosis Date  . Asthma   . Eye twitch     Past Surgical History:  Procedure Laterality Date  . None      There were no vitals filed for this visit.      Subjective Assessment - 11/14/15 1110    Subjective Walked about a block the other day.  It took 30 min.  Pain is minimal. Pt sees Dr. Renaye Rakers Monday pm.    Currently in Pain? Yes   Pain Score 4    Pain Location Knee   Pain Orientation Right   Pain Descriptors / Indicators Tightness   Pain Type Surgical pain   Pain Onset More than a month ago   Pain Frequency Intermittent   Aggravating Factors  end of day, walking   Pain Relieving Factors propping, taking off ace wrap, ice.    Effect of Pain on Daily Activities needs crutch, does not use brace much, feels like it limits him .     Pain Score 0   Pain Location Ankle   Pain Orientation Right            [facility] PT Assessment - 11/14/15 1119      AROM   Right Knee Extension -6   Right Knee Flexion 128     Strength   Right Hip ABduction 4+/5   Right Knee Flexion 3/5  pain    Right Knee Extension 4-/5  pain    Left Knee Extension --  quad lag 8 deg   Right Ankle Dorsiflexion 4+/5   Right Ankle Inversion 4/5   Right Ankle Eversion 4-/5                     [facility] Adult PT Treatment/Exercise -  11/14/15 1119      Knee/Hip Exercises: Stretches   Knee: Self-Stretch to increase Flexion Right;5 reps;10 seconds   Knee: Self-Stretch Limitations multiple methods, supine and sitting      Knee/Hip Exercises: Aerobic   Recumbent Bike level 7 LE only 7 min      Knee/Hip Exercises: Supine   Quad Sets Strengthening;Right;Left;1 set;15 reps   Heel Slides AAROM;Right;1 set;10 reps   Bridges with Beacher May Strengthening;Both;1 set;10 reps   Henreitta Leber with Clamshell Strengthening;Both;1 set;10 reps   Straight Leg Raises Strengthening;Right;1 set;10 reps   Straight Leg Raise with External Rotation Strengthening;Right;1 set;10 reps   Straight Leg Raise with External Rotation Limitations able to do in supine with full ROM      Knee/Hip Exercises: Prone   Hamstring Curl 2 sets;10 reps   Hip Extension Strengthening;Right;1 set;10 reps     Vasopneumatic   Number Minutes Vasopneumatic  15 minutes   Vasopnuematic Location  Knee   Vasopneumatic Pressure Medium   Vasopneumatic Temperature  32  deg                 PT Education - 11/14/15 1145    Education provided Yes   Education Details progress, muscle grades   Person(s) Educated Patient   Methods Explanation   Comprehension Verbalized understanding          PT Short Term Goals - 11/14/15 1131      PT SHORT TERM GOAL #1   Title Pt will be I with initial HEP for Rt. LE    Status Achieved     PT SHORT TERM GOAL #2   Title Pt will be able to walk in community with 1 crutch and min increase in pain.    Status Achieved     PT SHORT TERM GOAL #3   Title Pt will complete FOTO and set goal.    Status Achieved     PT SHORT TERM GOAL #4   Title Pt will increase AROM in Rt. knee flexion to 115 deg    Status Achieved     PT SHORT TERM GOAL #5   Title Pt will demo R ankle strength to 4/5 throughout or greater to improve gait stability   Baseline inv and ev 4/5    Status Achieved           PT Long Term Goals - 11/14/15  1132      PT LONG TERM GOAL #1   Title Pt will be I with concepts of RICE, more advanced LE HEP.    Status On-going     PT LONG TERM GOAL #2   Title Pt will demo Rt. knee AROM to -3 to 120 deg or better for transfers and normalized gait.    Status On-going     PT LONG TERM GOAL #3   Title Pt will demo 4+/5 strength in Rt. LE in major muscle groups for normalized gait.    Status On-going     PT LONG TERM GOAL #4   Title Pt will walk as needed in the community without pain, min limp and without device.    Status On-going     PT LONG TERM GOAL #5   Title FOTO score will be 37% impaired or less to demo functional improvement. .     Status Unable to assess               Plan - 11/14/15 1135    Clinical Impression Statement Pt able to maintain good AROM in Rt. knee, can do hamstring curls with pain tolerable (pt was initially unable).  Has gradually reduced his dependence on assistive device (1 crutch)_ Skilled PT needed to continue making gains and progress independence with gait, mobility.    PT Next Visit Plan cont strength of Rt. LE, progress ROM.  slowly work into standing ex as tolerated. Bike, ice, FOTO upcoming    PT Home Exercise Plan hamstring curls and LAQ with yellow band   Consulted and Agree with Plan of Care Patient      Patient will benefit from skilled therapeutic intervention in order to improve the following deficits and impairments:  Abnormal gait, Decreased range of motion, Difficulty walking, Increased fascial restricitons, Decreased activity tolerance, Pain, Impaired flexibility, Decreased scar mobility, Decreased balance, Decreased mobility, Decreased strength, Increased edema, Impaired sensation, Postural dysfunction  Visit Diagnosis: Difficulty in walking, not elsewhere classified  Localized edema  Stiffness of right knee, not elsewhere classified  Stiffness of right ankle, not elsewhere classified  Muscle weakness (  generalized)     Problem  List Patient Active Problem List   Diagnosis Date Noted  . Tremor 10/17/2013    [name] 11/14/2015, 11:48 AM  [facility] [address] [place], [place], [address] Phone: [phone]   Fax:  [phone]  Name: DAION GINSBERG MRN: [id number] Date of Birth: 1995/10/09  Karie Mainland, PT 11/14/15 11:48 AM Phone: [phone] Fax: [phone]

## 2015-11-17 ENCOUNTER — Ambulatory Visit: Payer: 59 | Admitting: Physical Therapy

## 2015-11-17 DIAGNOSIS — R262 Difficulty in walking, not elsewhere classified: Secondary | ICD-10-CM | POA: Diagnosis not present

## 2015-11-17 DIAGNOSIS — M25671 Stiffness of right ankle, not elsewhere classified: Secondary | ICD-10-CM

## 2015-11-17 DIAGNOSIS — M25661 Stiffness of right knee, not elsewhere classified: Secondary | ICD-10-CM

## 2015-11-17 DIAGNOSIS — R600 Localized edema: Secondary | ICD-10-CM

## 2015-11-17 DIAGNOSIS — M6281 Muscle weakness (generalized): Secondary | ICD-10-CM | POA: Diagnosis not present

## 2015-11-17 DIAGNOSIS — M79661 Pain in right lower leg: Secondary | ICD-10-CM | POA: Diagnosis not present

## 2015-11-17 NOTE — Therapy (Signed)
[facility] [address] [place], [place], [address] Phone: [phone]   Fax:  [phone]  Physical Therapy Treatment  Patient Details  Name: Samuel Blankenship MRN: [id number] Date of Birth: 12-08-1995 Referring Provider: Eulah Pont   Encounter Date: 11/17/2015      PT End of Session - 11/17/15 1753    Visit Number 9   Number of Visits 24   Date for PT Re-Evaluation --   PT Start Time 1419   PT Stop Time 1519   PT Time Calculation (min) 60 min   Activity Tolerance Patient tolerated treatment well   Behavior During Therapy [facility] for tasks assessed/performed      Past Medical History:  Diagnosis Date  . Asthma   . Eye twitch     Past Surgical History:  Procedure Laterality Date  . None      There were no vitals filed for this visit.      Subjective Assessment - 11/17/15 1425    Subjective Saw MD.  No restrictions , so if he feels he can do it ,He is allowed.   Mild pain at end of the day. See's MD IN December 13th (8 weeks)   Currently in Pain? No/denies   Pain Location Knee   Pain Orientation Right   Pain Descriptors / Indicators --  stiff, edematous   Aggravating Factors  !st thing in am,  at end of day   Pain Relieving Factors movinit around, elevatrion, Meds   Multiple Pain Sites Yes   Pain Score 0   Pain Location Ankle   Pain Orientation Right   Pain Descriptors / Indicators --  weakness   Aggravating Factors  end of day   Pain Relieving Factors [place], elevation rest,              [facility] PT Assessment - 11/17/15 0001      Observation/Other Assessments   Focus on Therapeutic Outcomes (FOTO)  FOTO45% limitation                     [facility] Adult PT Treatment/Exercise - 11/17/15 0001      High Level Balance   High Level Balance Comments standing right, sliding pillowcase on floor 5 x each 3 way,  CGA for safety.      Lumbar Exercises: Machines for Strengthening   Leg Press 1, 2  plates  10 X both legs     Knee/Hip Exercises: Stretches   Passive Hamstring Stretch 3 reps;30 seconds;Right   Passive Hamstring Stretch Limitations mild stretch tolerated.     Knee/Hip Exercises: Aerobic   Recumbent Bike L1 6 minutes, 1.8 miles     Knee/Hip Exercises: Standing   Heel Raises 10 reps;2 sets  10 X toe lifts   Forward Step Up Right;1 set;Hand Hold: 2;Step Height: 4";Limitations   Forward Step Up Limitations shakey,  some mild pain       Knee/Hip Exercises: Supine   Heel Slides 10 reps   Heel Slides Limitations legs on ball for assist   Bridges 10 reps   Patellar Mobs checked, non tight   Knee Extension 5 reps  able to touch back of knee to table     Vasopneumatic   Number Minutes Vasopneumatic  15 minutes   Vasopnuematic Location  Knee   Vasopneumatic Pressure Medium   Vasopneumatic Temperature  32                  PT Short Term  Goals - 11/14/15 1131      PT SHORT TERM GOAL #1   Title Pt will be I with initial HEP for Rt. LE    Status Achieved     PT SHORT TERM GOAL #2   Title Pt will be able to walk in community with 1 crutch and min increase in pain.    Status Achieved     PT SHORT TERM GOAL #3   Title Pt will complete FOTO and set goal.    Status Achieved     PT SHORT TERM GOAL #4   Title Pt will increase AROM in Rt. knee flexion to 115 deg    Status Achieved     PT SHORT TERM GOAL #5   Title Pt will demo R ankle strength to 4/5 throughout or greater to improve gait stability   Baseline inv and ev 4/5    Status Achieved           PT Long Term Goals - 11/17/15 1758      PT LONG TERM GOAL #1   Title Pt will be I with concepts of RICE, more advanced LE HEP.    Baseline uses RICE at home,  HEP on going.  Some decreased compliance due to return to work/life activities   Time 8   Period Weeks   Status On-going     PT LONG TERM GOAL #2   Title Pt will demo Rt. knee AROM to -3 to 120 deg or better for transfers and normalized  gait.    Time 8   Status Unable to assess     PT LONG TERM GOAL #3   Title Pt will demo 4+/5 strength in Rt. LE in major muscle groups for normalized gait.    Time 8   Period Weeks   Status Unable to assess     PT LONG TERM GOAL #4   Title Pt will walk as needed in the community without pain, min limp and without device.    Baseline cane, single crutch in community, flat surfaces.   Time 8   Period Weeks   Status On-going     PT LONG TERM GOAL #5   Title FOTO score will be 37% impaired or less to demo functional improvement. .     Baseline 45% limitation   Time 8   Status On-going               Plan - 11/17/15 1756    Clinical Impression Statement No pain at rest post session, mild pain with moving post session.  FOTO improved from 62% limitation to 45% limitation.  Paient was able to progress toward more closed chain exeecises.  His MD has cleared him to do what he feels he can.    PT Next Visit Plan cont strength of Rt. LE, progress ROM.  slowly work into standing ex as tolerated. Bike, ice,    PT Home Exercise Plan hamstring curls and LAQ with yellow band   Consulted and Agree with Plan of Care Patient      Patient will benefit from skilled therapeutic intervention in order to improve the following deficits and impairments:  Abnormal gait, Decreased range of motion, Difficulty walking, Increased fascial restricitons, Decreased activity tolerance, Pain, Impaired flexibility, Decreased scar mobility, Decreased balance, Decreased mobility, Decreased strength, Increased edema, Impaired sensation, Postural dysfunction  Visit Diagnosis: Difficulty in walking, not elsewhere classified  Localized edema  Stiffness of right knee, not elsewhere classified  Stiffness of  right ankle, not elsewhere classified  Muscle weakness (generalized)     Problem List Patient Active Problem List   Diagnosis Date Noted  . Tremor 10/17/2013    Tomica Arseneault PTA 11/17/2015, 6:01  PM  [facility] [address] [place], [place], [address] Phone: [phone]   Fax:  [phone]  Name: KORIN HARTWELL MRN: [id number] Date of Birth: [birth year]

## 2015-11-19 ENCOUNTER — Ambulatory Visit: Payer: 59 | Admitting: Physical Therapy

## 2015-11-19 DIAGNOSIS — M25661 Stiffness of right knee, not elsewhere classified: Secondary | ICD-10-CM | POA: Diagnosis not present

## 2015-11-19 DIAGNOSIS — R262 Difficulty in walking, not elsewhere classified: Secondary | ICD-10-CM | POA: Diagnosis not present

## 2015-11-19 DIAGNOSIS — R600 Localized edema: Secondary | ICD-10-CM | POA: Diagnosis not present

## 2015-11-19 DIAGNOSIS — M25671 Stiffness of right ankle, not elsewhere classified: Secondary | ICD-10-CM | POA: Diagnosis not present

## 2015-11-19 DIAGNOSIS — M6281 Muscle weakness (generalized): Secondary | ICD-10-CM | POA: Diagnosis not present

## 2015-11-19 NOTE — Therapy (Signed)
[facility] [address] [place], [place], [address] Phone: [phone]   Fax:  [phone]  Physical Therapy Treatment  Patient Details  Name: Samuel Blankenship MRN: [id number] Date of Birth: August 25, 1995 Referring Provider: Eulah PontMurphy   Encounter Date: 11/19/2015      PT End of Session - 11/19/15 1259    Visit Number 10   Number of Visits 24   Date for PT Re-Evaluation 12/23/15   PT Start Time 1020   PT Stop Time 1114   PT Time Calculation (min) 54 min   Activity Tolerance Patient tolerated treatment well   Behavior [facility] for tasks assessed/performed      Past Medical History:  Diagnosis Date  . Asthma   . Eye twitch     Past Surgical History:  Procedure Laterality Date  . None      There were no vitals filed for this visit.      Subjective Assessment - 11/19/15 1027    Subjective Walks in with cane.  Feeling good, has the usual min pain.    Currently in Pain? No/denies                         [facility]/Ex[facility] - 11/19/15 1027      Knee/Hip Exercises: Stretches   Active Hamstring Stretch (P)  Right;2 reps;30 seconds     Knee/Hip Exercises: Aerobic   Recumbent Bike L3 for 6 min fastpace      Knee/Hip Exercises: Standing   Forward Lunges Right;Left;1 set;5 reps   Forward Lunges Limitations 10 sec hold for static balance    Wall Squat 3 sets;10 reps   Wall Squat Limitations 1/4 hold with heel lift and toe tapx x 10    SLS with Vectors slide against wall , hip abduction 2 x 10 each leg   cane for support    Other Standing Knee Exercises standing eccentric hamstring x 10 wth UE support on countertop      Knee/Hip Exercises: Seated   Stool Scoot - Round Trips 25 feet x 2      Knee/Hip Exercises: Supine   Heel Slides (P)  AAROM;Strengthening;1 set;20 reps   Bridges (P)  Both;1 set;10 reps     Knee/Hip Exercises: Prone   Hamstring Curl 2 sets;10 reps   Hip  Extension Strengthening;Right;1 set;10 reps   Hip Extension Limitations cues to keep back from arching    Straight Leg Raises --  prone quad set Rt x 10      Vasopneumatic   Number Minutes Vasopneumatic  (P)  15 minutes   Vasopnuematic Location  (P)  Knee   Vasopneumatic Pressure (P)  Medium   Vasopneumatic Temperature  (P)  32                PT Education - 11/19/15 1048    Education provided No          PT Short Term Goals - 11/19/15 1046      PT SHORT TERM GOAL #1   Title Pt will be I with initial HEP for Rt. LE    Status Achieved     PT SHORT TERM GOAL #2   Title Pt will be able to walk in community with 1 crutch and min increase in pain.    Status Achieved     PT SHORT TERM GOAL #3   Title Pt will complete FOTO and set goal.  Status Achieved     PT SHORT TERM GOAL #4   Title Pt will increase AROM in Rt. knee flexion to 115 deg    Status Achieved     PT SHORT TERM GOAL #5   Title Pt will demo R ankle strength to 4/5 throughout or greater to improve gait stability   Status Achieved           PT Long Term Goals - 11/17/15 1758      PT LONG TERM GOAL #1   Title Pt will be I with concepts of RICE, more advanced LE HEP.    Baseline uses RICE at home,  HEP on going.  Some decreased compliance due to return to work/life activities   Time 8   Period Weeks   Status On-going     PT LONG TERM GOAL #2   Title Pt will demo Rt. knee AROM to -3 to 120 deg or better for transfers and normalized gait.    Time 8   Status Unable to assess     PT LONG TERM GOAL #3   Title Pt will demo 4+/5 strength in Rt. LE in major muscle groups for normalized gait.    Time 8   Period Weeks   Status Unable to assess     PT LONG TERM GOAL #4   Title Pt will walk as needed in the community without pain, min limp and without device.    Baseline cane, single crutch in community, flat surfaces.   Time 8   Period Weeks   Status On-going     PT LONG TERM GOAL #5   Title  FOTO score will be 37% impaired or less to demo functional improvement. .     Baseline 45% limitation   Time 8   Status On-going               Plan - 11/19/15 1047    Clinical Impression Statement Pt able to effectively perform knee flexion and hip ext with less pain.  Doing well, fatigues quickly.     PT Next Visit Plan cont strength of Rt. LE, progress ROM.  slowly work into standing ex as tolerated. Bike, ice,    PT Home Exercise Plan hamstring curls and LAQ with yellow band   Consulted and Agree with Plan of Care Patient      Patient will benefit from skilled therapeutic intervention in order to improve the following deficits and impairments:  Abnormal gait, Decreased range of motion, Difficulty walking, Increased fascial restricitons, Decreased activity tolerance, Pain, Impaired flexibility, Decreased scar mobility, Decreased balance, Decreased mobility, Decreased strength, Increased edema, Impaired sensation, Postural dysfunction  Visit Diagnosis: Difficulty in walking, not elsewhere classified  Localized edema  Stiffness of right knee, not elsewhere classified  Stiffness of right ankle, not elsewhere classified  Muscle weakness (generalized)     Problem List Patient Active Problem List   Diagnosis Date Noted  . Tremor 10/17/2013    Samuel Blankenship 11/19/2015, 1:03 PM  [facility] [address] [place], [place], [address] Phone: [phone]   Fax:  [phone]  Name: Samuel Blankenship MRN: [id number] Date of Birth: 10/23/1995  Karie Mainland, PT 11/19/15 1:03 PM Phone: [phone] Fax: [phone]

## 2015-11-20 DIAGNOSIS — S06890A Other specified intracranial injury without loss of consciousness, initial encounter: Secondary | ICD-10-CM | POA: Diagnosis not present

## 2015-11-21 ENCOUNTER — Ambulatory Visit: Payer: 59 | Admitting: Physical Therapy

## 2015-11-21 MED FILL — HYDROCODON-APAP 5-325: 5-325 | 30 days supply | Qty: 30 | Fill #0

## 2015-11-24 ENCOUNTER — Other Ambulatory Visit: Payer: Self-pay

## 2015-11-24 ENCOUNTER — Ambulatory Visit: Payer: 59 | Admitting: Physical Therapy

## 2015-11-24 DIAGNOSIS — M25671 Stiffness of right ankle, not elsewhere classified: Secondary | ICD-10-CM

## 2015-11-24 DIAGNOSIS — M6281 Muscle weakness (generalized): Secondary | ICD-10-CM

## 2015-11-24 DIAGNOSIS — R262 Difficulty in walking, not elsewhere classified: Secondary | ICD-10-CM | POA: Diagnosis not present

## 2015-11-24 DIAGNOSIS — R600 Localized edema: Secondary | ICD-10-CM

## 2015-11-24 DIAGNOSIS — M25661 Stiffness of right knee, not elsewhere classified: Secondary | ICD-10-CM

## 2015-11-24 MED ORDER — MUPIROCIN 2 % EX OINT: 1.0000 "application " | TOPICAL_OINTMENT | Freq: Two times a day (BID) | CUTANEOUS | 1 refills | Status: DC

## 2015-11-24 MED FILL — MUPIROCIN 2% OINTMENT: 2 | 7 days supply | Qty: 22 | Fill #0

## 2015-11-24 NOTE — Therapy (Signed)
[place], [place], [address] Phone: [phone]   Fax:  [phone]  Physical Therapy Treatment  Patient Details  Name: Samuel Blankenship MRN: [id number] Date of Birth: [birth year] Referring Provider: Percell Miller   Encounter Date: 11/24/2015      PT End of Session - 11/24/15 1059    Visit Number 11   Number of Visits 24   Date for PT Re-Evaluation 12/23/15   PT Start Time 9381   PT Stop Time 1115   PT Time Calculation (min) 60 min   Activity Tolerance Patient tolerated treatment well   Behavior During Therapy [facility] for tasks assessed/performed      Past Medical History:  Diagnosis Date  . Asthma   . Eye twitch     Past Surgical History:  Procedure Laterality Date  . None      There were no vitals filed for this visit.      Subjective Assessment - 11/24/15 1017    Subjective Missed his PT appt Friday, forgot, got busy at work.  No pain, really.  Knee was stiff yesterday.    How long can you sit comfortably? tries to prop leg when sitting    How long can you walk comfortably? with cane, 20 min with comfort    Currently in Pain? No/denies                         [facility] Adult PT Treatment/Exercise - 11/24/15 0001      Knee/Hip Exercises: Aerobic   Recumbent Bike 6 min Level 4     Knee/Hip Exercises: Standing   Forward Lunges Right;Left;1 set;5 reps   Forward Lunges Limitations 10 sec hold for static balance    Terminal Knee Extension Limitations x 20    Hip Abduction Stengthening;Both;1 set   Hip Extension Stengthening;Both;1 set;15 reps   Extension Limitations yellow back    Wall Squat 1 set;15 reps   Wall Squat Limitations with ball squeeze    Rocker Board 3 minutes   Rocker Board Limitations static and controlled ankle motion A/P, M/L   Other Standing Knee Exercises slant board for gastroc and soleus x 2, 30 sec each      Knee/Hip Exercises: Supine   Bridges  Strengthening;Both;2 sets;10 reps   Bridges Limitations ball and press down on heels      Knee/Hip Exercises: Prone   Hamstring Curl 2 sets;10 reps   Hip Extension Strengthening;Right;1 set;10 reps  glute lift   Hip Extension Limitations unable to do with knee ext   2 lbs      Vasopneumatic   Number Minutes Vasopneumatic  15 minutes   Vasopnuematic Location  Knee   Vasopneumatic Pressure Medium   Vasopneumatic Temperature  32     Manual Therapy   Manual Therapy Soft tissue mobilization   Soft tissue mobilization medial Rt. hamstring and gastrocsoleus, multiple areas of tenderness, taut bands, painful . Pt with decreased sensation to light touch mostly anterolateral LE                   PT Short Term Goals - 11/19/15 1046      PT SHORT TERM GOAL #1   Title Pt will be I with initial HEP for Rt. LE    Status Achieved     PT SHORT TERM GOAL #2   Title Pt will be able to walk in community with 1 crutch and min increase in  pain.    Status Achieved     PT SHORT TERM GOAL #3   Title Pt will complete FOTO and set goal.    Status Achieved     PT SHORT TERM GOAL #4   Title Pt will increase AROM in Rt. knee flexion to 115 deg    Status Achieved     PT SHORT TERM GOAL #5   Title Pt will demo R ankle strength to 4/5 throughout or greater to improve gait stability   Status Achieved           PT Long Term Goals - 11/24/15 1202      PT LONG TERM GOAL #1   Title Pt will be I with concepts of RICE, more advanced LE HEP.    Baseline uses RICE at home,  HEP on going.  Some decreased compliance due to return to work/life activities   Status Partially Met     PT LONG TERM GOAL #2   Title Pt will demo Rt. knee AROM to -3 to 120 deg or better for transfers and normalized gait.    Status Unable to assess     PT LONG TERM GOAL #3   Title Pt will demo 4+/5 strength in Rt. LE in major muscle groups for normalized gait.    Status On-going     PT LONG TERM GOAL #4   Title  Pt will walk as needed in the community without pain, min limp and without device.    Status On-going     PT LONG TERM GOAL #5   Title FOTO score will be 37% impaired or less to demo functional improvement. .     Status On-going               Plan - 11/24/15 1159    Clinical Impression Statement Pt cont to improve.  Has significant tightness in Rt. gastrocsoleus and would benefit from more soft tissue work, stretching.  Reports nerve damage and abnormal sensation anterior aspect of Rt. LE.  Develops muscle fatigue quickly.    PT Next Visit Plan cont strength of Rt. LE, progress/check ROM.  bike, vaso and develop closed chain strength, hip ext/abd and knee flexion   PT Home Exercise Plan hamstring curls and LAQ with yellow band, bridge, wall squat, heel raise, 4 way ankle T band and level 1 knee    Consulted and Agree with Plan of Care Patient      Patient will benefit from skilled therapeutic intervention in order to improve the following deficits and impairments:  Abnormal gait, Decreased range of motion, Difficulty walking, Increased fascial restricitons, Decreased activity tolerance, Pain, Impaired flexibility, Decreased scar mobility, Decreased balance, Decreased mobility, Decreased strength, Increased edema, Impaired sensation, Postural dysfunction  Visit Diagnosis: Difficulty in walking, not elsewhere classified  Localized edema  Stiffness of right knee, not elsewhere classified  Stiffness of right ankle, not elsewhere classified  Muscle weakness (generalized)     Problem List Patient Active Problem List   Diagnosis Date Noted  . Tremor 10/17/2013    Saydi Kobel 11/24/2015, 12:04 PM  [facility] [address] [place], [place], [address] Phone: [phone]   Fax:  [phone]  Name: Samuel Blankenship MRN: [id number] Date of Birth: [birth year]  Raeford Razor, PT 11/24/15 12:04 PM Phone: [phone] Fax:  [phone]

## 2015-11-26 ENCOUNTER — Other Ambulatory Visit: Payer: Self-pay

## 2015-11-26 ENCOUNTER — Ambulatory Visit: Payer: 59 | Admitting: Physical Therapy

## 2015-11-26 DIAGNOSIS — R600 Localized edema: Secondary | ICD-10-CM

## 2015-11-26 DIAGNOSIS — R262 Difficulty in walking, not elsewhere classified: Secondary | ICD-10-CM | POA: Diagnosis not present

## 2015-11-26 DIAGNOSIS — M25661 Stiffness of right knee, not elsewhere classified: Secondary | ICD-10-CM | POA: Diagnosis not present

## 2015-11-26 DIAGNOSIS — M25671 Stiffness of right ankle, not elsewhere classified: Secondary | ICD-10-CM

## 2015-11-26 DIAGNOSIS — M6281 Muscle weakness (generalized): Secondary | ICD-10-CM

## 2015-11-26 MED ORDER — DOXYCYCLINE HYCLATE 100 MG PO TABS: 100.0000 mg | ORAL_TABLET | Freq: Two times a day (BID) | ORAL | 1 refills | Status: DC

## 2015-11-26 MED FILL — DOXYCYCLINE HYCLATE 100 MG: 100 | 10 days supply | Qty: 20 | Fill #0

## 2015-11-26 NOTE — Therapy (Signed)
[place], [place], [address] Phone: [phone]   Fax:  [phone]  Physical Therapy Treatment  Patient Details  Name: Samuel Blankenship MRN: [id number] Date of Birth: 03/17/1995 Referring Provider: Percell Miller   Encounter Date: 11/26/2015      PT End of Session - 11/26/15 1023    Visit Number 12   Number of Visits 24   Date for PT Re-Evaluation 12/23/15   PT Start Time 1016   PT Stop Time 1120   PT Time Calculation (min) 64 min   Activity Tolerance Patient tolerated treatment well   Behavior During Therapy [facility] for tasks assessed/performed      Past Medical History:  Diagnosis Date  . Asthma   . Eye twitch     Past Surgical History:  Procedure Laterality Date  . None      There were no vitals filed for this visit.      Subjective Assessment - 11/26/15 1023    Subjective No new complaints.     Currently in Pain? No/denies            [facility] Adult PT Treatment/Exercise - 11/26/15 1025      Lumbar Exercises: Machines for Strengthening   Other Lumbar Machine Exercise Pilates TOWER see note     Knee/Hip Exercises: Standing   Other Standing Knee Exercises Standing Slastix squat x 10, squat hold with row x 10 multiple rest breaks   Other Standing Knee Exercises single leg and tandem stance, static and very min dynamic      Vasopneumatic   Number Minutes Vasopneumatic  15 minutes   Vasopnuematic Location  Knee   Vasopneumatic Pressure Medium   Vasopneumatic Temperature  32     Manual Therapy   Manual Therapy Soft tissue mobilization   Manual therapy comments lighter pressure used today.   Soft tissue mobilization medial Rt. hamstring and gastrocsoleus, multiple areas of tenderness, taut bands, painful . Pt with decreased sensation to light touch mostly anterolateral LE        Pilates Tower for LE/Core strength, postural strength, lumbopelvic disassociation and core control.  Exercises  included:  Bridging with block x 10, Rt. LE single leg bridge x 10  Bridge with Arm Arc  Supine Leg [place] single leg hamstring stretch (pain) follwed by arcs (pain)  Moved loop to thigh for bent knee raise and lower  Quad work  SLR with loop at thigh for post hip strength  Sidelying Leg [place] Yellow hip add/abd and sidekicks  Clam x 20 on Rt.         [place] [facility] Adult PT Treatment/Exercise - 11/26/15 1025      Lumbar Exercises: Machines for Strengthening   Other Lumbar Machine Exercise Pilates TOWER see note     Knee/Hip Exercises: Standing   Other Standing Knee Exercises Standing Slastix squat x 10, squat hold with row x 10 multiple rest breaks   Other Standing Knee Exercises single leg and tandem stance, static and very min dynamic      Vasopneumatic   Number Minutes Vasopneumatic  15 minutes   Vasopnuematic Location  Knee   Vasopneumatic Pressure Medium   Vasopneumatic Temperature  32     Manual Therapy   Manual Therapy Soft tissue mobilization   Manual therapy comments lighter pressure used today.   Soft tissue mobilization medial Rt. hamstring and gastrocsoleus, multiple areas of tenderness, taut bands, painful . Pt with decreased sensation to light touch mostly anterolateral LE  PT Short Term Goals - 11/19/15 1046      PT SHORT TERM GOAL #1   Title Pt will be I with initial HEP for Rt. LE    Status Achieved     PT SHORT TERM GOAL #2   Title Pt will be able to walk in community with 1 crutch and min increase in pain.    Status Achieved     PT SHORT TERM GOAL #3   Title Pt will complete FOTO and set goal.    Status Achieved     PT SHORT TERM GOAL #4   Title Pt will increase AROM in Rt. knee flexion to 115 deg    Status Achieved     PT SHORT TERM GOAL #5   Title Pt will demo R ankle strength to 4/5 throughout or greater to improve gait stability   Status Achieved           PT Long Term Goals - 11/24/15 1202      PT LONG TERM  GOAL #1   Title Pt will be I with concepts of RICE, more advanced LE HEP.    Baseline uses RICE at home,  HEP on going.  Some decreased compliance due to return to work/life activities   Status Partially Met     PT LONG TERM GOAL #2   Title Pt will demo Rt. knee AROM to -3 to 120 deg or better for transfers and normalized gait.    Status Unable to assess     PT LONG TERM GOAL #3   Title Pt will demo 4+/5 strength in Rt. LE in major muscle groups for normalized gait.    Status On-going     PT LONG TERM GOAL #4   Title Pt will walk as needed in the community without pain, min limp and without device.    Status On-going     PT LONG TERM GOAL #5   Title FOTO score will be 37% impaired or less to demo functional improvement. .     Status On-going               Plan - 11/26/15 1104    Clinical Impression Statement Unable to do single leg balance without UE assist.  Used Tower for supported post thigh and core work.  Needed manual A to control springs.  PLeased with progress, workig towards goals in strength, gait.    PT Next Visit Plan cont strength of Rt. LE, progress/check ROM.  bike, vaso and develop closed chain strength, hip ext/abd and knee flexion   PT Home Exercise Plan hamstring curls and LAQ with yellow band, bridge, wall squat, heel raise, 4 way ankle T band and level 1 knee    Consulted and Agree with Plan of Care Patient      Patient will benefit from skilled therapeutic intervention in order to improve the following deficits and impairments:  Abnormal gait, Decreased range of motion, Difficulty walking, Increased fascial restricitons, Decreased activity tolerance, Pain, Impaired flexibility, Decreased scar mobility, Decreased balance, Decreased mobility, Decreased strength, Increased edema, Impaired sensation, Postural dysfunction  Visit Diagnosis: Difficulty in walking, not elsewhere classified  Localized edema  Stiffness of right knee, not elsewhere  classified  Stiffness of right ankle, not elsewhere classified  Muscle weakness (generalized)     Problem List Patient Active Problem List   Diagnosis Date Noted  . Tremor 10/17/2013    PAA,JENNIFER 11/26/2015, 11:09 AM  [facility]  [place], [place], [address] Phone: [phone]   Fax:  [phone]  Name: Samuel Blankenship MRN: [id number] Date of Birth: 1995-06-24  Raeford Razor, PT 11/26/15 11:09 AM Phone: [phone] Fax: [phone]

## 2015-11-28 ENCOUNTER — Ambulatory Visit: Payer: 59 | Admitting: Physical Therapy

## 2015-11-28 DIAGNOSIS — M25671 Stiffness of right ankle, not elsewhere classified: Secondary | ICD-10-CM

## 2015-11-28 DIAGNOSIS — M25661 Stiffness of right knee, not elsewhere classified: Secondary | ICD-10-CM | POA: Diagnosis not present

## 2015-11-28 DIAGNOSIS — M6281 Muscle weakness (generalized): Secondary | ICD-10-CM | POA: Diagnosis not present

## 2015-11-28 DIAGNOSIS — R600 Localized edema: Secondary | ICD-10-CM | POA: Diagnosis not present

## 2015-11-28 DIAGNOSIS — R262 Difficulty in walking, not elsewhere classified: Secondary | ICD-10-CM

## 2015-11-28 NOTE — Therapy (Signed)
[place], [place], [address] Phone: [phone]   Fax:  [phone]  Physical Therapy Treatment  Patient Details  Name: Samuel Blankenship MRN: [id number] Date of Birth: 02-02-1995 Referring Provider: Percell Miller   Encounter Date: 11/28/2015      PT End of Session - 11/28/15 1022    Visit Number 13   Number of Visits 24   Date for PT Re-Evaluation 12/23/15   PT Start Time 4665   PT Stop Time 1115   PT Time Calculation (min) 60 min      Past Medical History:  Diagnosis Date  . Asthma   . Eye twitch     Past Surgical History:  Procedure Laterality Date  . None      There were no vitals filed for this visit.      Subjective Assessment - 11/28/15 1111    Currently in Pain? No/denies            [facility] PT Assessment - 11/28/15 0001      AROM   Right Knee Extension -5   Right Knee Flexion 128                     [facility] Adult PT Treatment/Exercise - 11/28/15 0001      Ambulation/Gait   Stairs Yes   Stairs Assistance 6: Modified independent (Device/Increase time)   Stair Management Technique One rail Left;Alternating pattern   Number of Stairs 5   Height of Stairs 6     Lumbar Exercises: Machines for Strengthening   Leg Press 35# single leg 3 x 10 95# bilateral 10 x 3      Knee/Hip Exercises: Stretches   Active Hamstring Stretch Right;2 reps;30 seconds   Active Hamstring Stretch Limitations long sitting and heel on floor.      Knee/Hip Exercises: Aerobic   Recumbent Bike L3 for 6 min fastpace      Knee/Hip Exercises: Standing   Forward Step Up Hand Hold: 1;Step Height: 4";Step Height: 6"   Forward Step Up Limitations tactile cues and verbal cues to avoid compensations   Functional Squat 10 reps   Functional Squat Limitations shiftng to RLE      Knee/Hip Exercises: Seated   Sit to Sand 10 reps  with RLE back      Knee/Hip Exercises: Supine   Bridges  Strengthening;Both;2 sets;10 reps   Bridges Limitations ball and press down on heels , manual resistance on h/s curls on ball the bridge during h/s curl on ball, bridge with ball under calves   Straight Leg Raises Strengthening;Right;1 set;10 reps   Straight Leg Raises Limitations hold 5 seconds at 6 inches.      Vasopneumatic   Number Minutes Vasopneumatic  15 minutes   Vasopnuematic Location  Knee   Vasopneumatic Pressure Low   Vasopneumatic Temperature  32                  PT Short Term Goals - 11/19/15 1046      PT SHORT TERM GOAL #1   Title Pt will be I with initial HEP for Rt. LE    Status Achieved     PT SHORT TERM GOAL #2   Title Pt will be able to walk in community with 1 crutch and min increase in pain.    Status Achieved     PT SHORT TERM GOAL #3   Title Pt will complete FOTO and set goal.  Status Achieved     PT SHORT TERM GOAL #4   Title Pt will increase AROM in Rt. knee flexion to 115 deg    Status Achieved     PT SHORT TERM GOAL #5   Title Pt will demo R ankle strength to 4/5 throughout or greater to improve gait stability   Status Achieved           PT Long Term Goals - 11/24/15 1202      PT LONG TERM GOAL #1   Title Pt will be I with concepts of RICE, more advanced LE HEP.    Baseline uses RICE at home,  HEP on going.  Some decreased compliance due to return to work/life activities   Status Partially Met     PT LONG TERM GOAL #2   Title Pt will demo Rt. knee AROM to -3 to 120 deg or better for transfers and normalized gait.    Status Unable to assess     PT LONG TERM GOAL #3   Title Pt will demo 4+/5 strength in Rt. LE in major muscle groups for normalized gait.    Status On-going     PT LONG TERM GOAL #4   Title Pt will walk as needed in the community without pain, min limp and without device.    Status On-going     PT LONG TERM GOAL #5   Title FOTO score will be 37% impaired or less to demo functional improvement. .      Status On-going               Plan - 11/28/15 1105    Clinical Impression Statement Pt reports he does not stretch his hamstrings, he only does leg lifts. Instructed pt in 3 diffferent ways to stretch hamstring and encourage heat and massage to back of knee. 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 Asked him to perform more closed chain exercises to build strength. Decreased motor control with concentric and eccentric quad exercises. No increased pain.    PT Next Visit Plan cont strength of Rt. LE, progress/check ROM.  bike, vaso and develop closed chain strength, hip ext/abd and knee flexion   PT Home Exercise Plan hamstring curls and LAQ with yellow band, bridge, wall squat, heel raise, 4 way ankle T band and level 1 knee       Patient will benefit from skilled therapeutic intervention in order to improve the following deficits and impairments:  Abnormal gait, Decreased range of motion, Difficulty walking, Increased fascial restricitons, Decreased activity tolerance, Pain, Impaired flexibility, Decreased scar mobility, Decreased balance, Decreased mobility, Decreased strength, Increased edema, Impaired sensation, Postural dysfunction  Visit Diagnosis: Difficulty in walking, not elsewhere classified  Localized edema  Stiffness of right knee, not elsewhere classified  Stiffness of right ankle, not elsewhere classified  Muscle weakness (generalized)     Problem List Patient Active Problem List   Diagnosis Date Noted  . Tremor 10/17/2013    Dorene Ar, PTA 11/28/2015, 11:13 AM  [place], [place], [address] Phone: [phone]   Fax:  [phone]  Name: Samuel Blankenship MRN: [id number] Date of Birth: September 21, 1995

## 2015-12-01 ENCOUNTER — Ambulatory Visit: Payer: 59 | Admitting: Physical Therapy

## 2015-12-01 DIAGNOSIS — R262 Difficulty in walking, not elsewhere classified: Secondary | ICD-10-CM | POA: Diagnosis not present

## 2015-12-01 DIAGNOSIS — R600 Localized edema: Secondary | ICD-10-CM | POA: Diagnosis not present

## 2015-12-01 DIAGNOSIS — M6281 Muscle weakness (generalized): Secondary | ICD-10-CM | POA: Diagnosis not present

## 2015-12-01 DIAGNOSIS — M25671 Stiffness of right ankle, not elsewhere classified: Secondary | ICD-10-CM

## 2015-12-01 DIAGNOSIS — M25661 Stiffness of right knee, not elsewhere classified: Secondary | ICD-10-CM

## 2015-12-01 NOTE — Therapy (Signed)
[place], [place], [address] Phone: [phone]   Fax:  [phone]  Physical Therapy Treatment  Patient Details  Name: Samuel Blankenship MRN: [id number] Date of Birth: July 13, 1995 Referring Provider: Percell Miller   Encounter Date: 12/01/2015      PT End of Session - 12/01/15 1228    Visit Number 14   Number of Visits 24   Date for PT Re-Evaluation 12/23/15   PT Start Time 0762   PT Stop Time 1240   PT Time Calculation (min) 55 min   Activity Tolerance Patient tolerated treatment well   Behavior During Therapy [facility] for tasks assessed/performed      Past Medical History:  Diagnosis Date  . Asthma   . Eye twitch     Past Surgical History:  Procedure Laterality Date  . None      There were no vitals filed for this visit.      Subjective Assessment - 12/01/15 1152    Subjective Sore today.  Cooked last night was standing 2-3 hours.             [facility] Adult PT Treatment/Exercise - 12/01/15 1203      Lumbar Exercises: Machines for Strengthening   Cybex Knee Extension 3 plates bilateral x 20, 1.5 plates Rt. LE x 10 concentrci and 2 plates x 10 eccentric      Knee/Hip Exercises: Stretches   Active Hamstring Stretch Right;3 reps;30 seconds   Active Hamstring Stretch Limitations long sitting and heel on floor.      Knee/Hip Exercises: Aerobic   Recumbent Bike L3 for 6 min fastpace      Knee/Hip Exercises: Standing   Heel Raises Both;1 set;3 sets;20 reps   Heel Raises Limitations 1 set eccentric , 1 set concentric, 1 set bilat.   x 10 single leg    Rebounder SLS Rt LE catch and toss, unable to catch and maintain balance,  then tried narrow and tandem   wide squat hold for x 10 catch /toss     Vasopneumatic   Number Minutes Vasopneumatic  15 minutes   Vasopnuematic Location  Knee   Vasopneumatic Pressure Low   Vasopneumatic Temperature  32                PT Education - 12/01/15  1224    Education provided Yes   Education Details hamstring stretching    Person(s) Educated Patient   Methods Explanation;Demonstration   Comprehension Verbalized understanding;Returned demonstration          PT Short Term Goals - 11/28/15 1113      PT SHORT TERM GOAL #1   Title Pt will be I with initial HEP for Rt. LE    Status Achieved     PT SHORT TERM GOAL #2   Title Pt will be able to walk in community with 1 crutch and min increase in pain.    Status Achieved     PT SHORT TERM GOAL #3   Title Pt will complete FOTO and set goal.    Status Achieved     PT SHORT TERM GOAL #4   Title Pt will increase AROM in Rt. knee flexion to 115 deg    Status Achieved     PT SHORT TERM GOAL #5   Title Pt will demo R ankle strength to 4/5 throughout or greater to improve gait stability   Status Achieved           PT  Long Term Goals - 11/28/15 1113      PT LONG TERM GOAL #1   Title Pt will be I with concepts of RICE, more advanced LE HEP.    Baseline uses RICE at home,  HEP on going.  Some decreased compliance due to return to work/life activities   Time 8   Period Weeks   Status Partially Met     PT LONG TERM GOAL #2   Title Pt will demo Rt. knee AROM to -3 to 120 deg or better for transfers and normalized gait.    Baseline 5-128   Time 8   Period Weeks   Status Partially Met     PT LONG TERM GOAL #3   Title Pt will demo 4+/5 strength in Rt. LE in major muscle groups for normalized gait.    Status On-going     PT LONG TERM GOAL #4   Title Pt will walk as needed in the community without pain, min limp and without device.    Baseline cane, single crutch in community, flat surfaces, antalgic   Time 8   Period Weeks   Status On-going     PT LONG TERM GOAL #5   Title FOTO score will be 37% impaired or less to demo functional improvement. .     Time 8   Period Weeks   Status Unable to assess               Plan - 12/01/15 1246    Clinical Impression  Statement Pt cont to do well, worked on eccentric control of LE muscle groups.  Unsteady in SLS.  Reinforced need to challenge his strength and develop muscle endurance to restore full function.    PT Next Visit Plan cont strength of Rt. LE,  bike, vaso and develop closed chain strength, hip ext/abd and knee flexion   PT Home Exercise Plan hamstring curls and LAQ with yellow band, bridge, wall squat, heel raise, 4 way ankle T band and level 1 knee    Consulted and Agree with Plan of Care Patient      Patient will benefit from skilled therapeutic intervention in order to improve the following deficits and impairments:  Abnormal gait, Decreased range of motion, Difficulty walking, Increased fascial restricitons, Decreased activity tolerance, Pain, Impaired flexibility, Decreased scar mobility, Decreased balance, Decreased mobility, Decreased strength, Increased edema, Impaired sensation, Postural dysfunction  Visit Diagnosis: Difficulty in walking, not elsewhere classified  Localized edema  Stiffness of right knee, not elsewhere classified  Stiffness of right ankle, not elsewhere classified  Muscle weakness (generalized)     Problem List Patient Active Problem List   Diagnosis Date Noted  . Tremor 10/17/2013    Samuel Blankenship 12/01/2015, 12:57 PM  [facility] [address] [place], [place], [address] Phone: [phone]   Fax:  [phone]  Name: Samuel Blankenship MRN: [id number] Date of Birth: [birth year]   Raeford Razor, PT 12/01/15 12:57 PM Phone: [phone] Fax: [phone]

## 2015-12-09 ENCOUNTER — Ambulatory Visit: Payer: 59 | Attending: Orthopedic Surgery | Admitting: Physical Therapy

## 2015-12-09 DIAGNOSIS — M25661 Stiffness of right knee, not elsewhere classified: Secondary | ICD-10-CM | POA: Insufficient documentation

## 2015-12-09 DIAGNOSIS — R600 Localized edema: Secondary | ICD-10-CM | POA: Insufficient documentation

## 2015-12-09 DIAGNOSIS — R262 Difficulty in walking, not elsewhere classified: Secondary | ICD-10-CM | POA: Insufficient documentation

## 2015-12-09 DIAGNOSIS — M6281 Muscle weakness (generalized): Secondary | ICD-10-CM | POA: Diagnosis not present

## 2015-12-09 DIAGNOSIS — M25671 Stiffness of right ankle, not elsewhere classified: Secondary | ICD-10-CM | POA: Diagnosis not present

## 2015-12-09 NOTE — Therapy (Signed)
[place], [place], [address] Phone: [phone]   Fax:  [phone]  Physical Therapy Treatment  Patient Details  Name: Samuel Blankenship MRN: [id number] Date of Birth: 17-Aug-1995 Referring Provider: Percell Miller   Encounter Date: 12/09/2015      PT End of Session - 12/09/15 1123    Visit Number 15   Number of Visits 24   Date for PT Re-Evaluation 12/23/15   PT Start Time 1119  patient late   PT Stop Time 1200   PT Time Calculation (min) 41 min      Past Medical History:  Diagnosis Date  . Asthma   . Eye twitch     Past Surgical History:  Procedure Laterality Date  . None      There were no vitals filed for this visit.                       [facility] Adult PT Treatment/Exercise - 12/09/15 0001      Lumbar Exercises: Machines for Strengthening   Cybex Knee Extension 15# bilateral    Leg Press 45# single leg 3 x 10 95# bilateral 10 x 3      Knee/Hip Exercises: Stretches   Active Hamstring Stretch Right;3 reps;30 seconds   Active Hamstring Stretch Limitations long sitting and heel on floor.    Other Knee/Hip Stretches slant board stretch 3 x30 sec     Knee/Hip Exercises: Aerobic   Recumbent Bike L4 for 6 min fastpace      Knee/Hip Exercises: Standing   Forward Step Up Right;20 reps;Hand Hold: 1;Step Height: 8"   Rebounder SLS RLE 4 toss best with red ball. Tandem stance RLE back 20 tosses without LOB, SLS with opposite LE on BOSU x 20      Cryotherapy   Number Minutes Cryotherapy 15 Minutes   Cryotherapy Location Knee   Type of Cryotherapy Ice pack                  PT Short Term Goals - 11/28/15 1113      PT SHORT TERM GOAL #1   Title Pt will be I with initial HEP for Rt. LE    Status Achieved     PT SHORT TERM GOAL #2   Title Pt will be able to walk in community with 1 crutch and min increase in pain.    Status Achieved     PT SHORT TERM GOAL #3   Title  Pt will complete FOTO and set goal.    Status Achieved     PT SHORT TERM GOAL #4   Title Pt will increase AROM in Rt. knee flexion to 115 deg    Status Achieved     PT SHORT TERM GOAL #5   Title Pt will demo R ankle strength to 4/5 throughout or greater to improve gait stability   Status Achieved           PT Long Term Goals - 12/09/15 1124      PT LONG TERM GOAL #1   Title Pt will be I with concepts of RICE, more advanced LE HEP.    Baseline uses RICE at home,  HEP on going.  Some decreased compliance due to return to work/life activities   Time 8   Period Weeks   Status Partially Met     PT LONG TERM GOAL #2   Title Pt will demo Rt. knee AROM to -  3 to 120 deg or better for transfers and normalized gait.    Baseline 5-128   Period Weeks   Status Partially Met     PT LONG TERM GOAL #3   Title Pt will demo 4+/5 strength in Rt. LE in major muscle groups for normalized gait.    Time 8   Period Weeks   Status On-going     PT LONG TERM GOAL #4   Title Pt will walk as needed in the community without pain, min limp and without device.    Baseline cane, single crutch in community, flat surfaces, antalgic   Time 8   Period Weeks   Status On-going     PT LONG TERM GOAL #5   Title FOTO score will be 37% impaired or less to demo functional improvement. .     Baseline 45% limitation 11/17/2015   Status Unable to assess               Plan - 12/09/15 1125    Clinical Impression Statement Pt able to perfrom SLS exercises. 3 tosses at [employer] without LOB. Improved quad/hamstring endurance. Poor motor control continues with step ups.    PT Next Visit Plan REcheck FOTO next visit.    PT Home Exercise Plan hamstring curls and LAQ with yellow band, bridge, wall squat, heel raise, 4 way ankle T band and level 1 knee       Patient will benefit from skilled therapeutic intervention in order to improve the following deficits and impairments:  Abnormal gait, Decreased range  of motion, Difficulty walking, Increased fascial restricitons, Decreased activity tolerance, Pain, Impaired flexibility, Decreased scar mobility, Decreased balance, Decreased mobility, Decreased strength, Increased edema, Impaired sensation, Postural dysfunction  Visit Diagnosis: Difficulty in walking, not elsewhere classified  Localized edema  Muscle weakness (generalized)  Stiffness of right knee, not elsewhere classified     Problem List Patient Active Problem List   Diagnosis Date Noted  . Tremor 10/17/2013    Dorene Ar, PTA 12/09/2015, 4:12 PM  [facility] [address] [place], [place], [address] Phone: [phone]   Fax:  [phone]  Name: Samuel Blankenship MRN: [id number] Date of Birth: 01/27/1996

## 2015-12-11 ENCOUNTER — Ambulatory Visit: Payer: 59 | Admitting: Physical Therapy

## 2015-12-11 DIAGNOSIS — R262 Difficulty in walking, not elsewhere classified: Secondary | ICD-10-CM | POA: Diagnosis not present

## 2015-12-11 DIAGNOSIS — M25661 Stiffness of right knee, not elsewhere classified: Secondary | ICD-10-CM

## 2015-12-11 DIAGNOSIS — M25671 Stiffness of right ankle, not elsewhere classified: Secondary | ICD-10-CM | POA: Diagnosis not present

## 2015-12-11 DIAGNOSIS — R600 Localized edema: Secondary | ICD-10-CM

## 2015-12-11 DIAGNOSIS — M6281 Muscle weakness (generalized): Secondary | ICD-10-CM | POA: Diagnosis not present

## 2015-12-11 NOTE — Therapy (Signed)
[place], [place], [address] Phone: [phone]   Fax:  [phone]  Physical Therapy Treatment  Patient Details  Name: Samuel Blankenship MRN: [id number] Date of Birth: 07-04-1995 Referring Provider: Percell Miller   Encounter Date: 12/11/2015      PT End of Session - 12/11/15 1027    Visit Number 16   Number of Visits 24   PT Start Time 1020   PT Stop Time 1115   PT Time Calculation (min) 55 min      Past Medical History:  Diagnosis Date  . Asthma   . Eye twitch     Past Surgical History:  Procedure Laterality Date  . None      There were no vitals filed for this visit.          [facility] PT Assessment - 12/11/15 0001      Observation/Other Assessments   Focus on Therapeutic Outcomes (FOTO)  33% limited improved from 62% on Eval                      [facility] Adult PT Treatment/Exercise - 12/11/15 0001      Lumbar Exercises: Machines for Strengthening   Cybex Knee Extension 20# 10 x 3   Leg Press 45# single leg 3 x 10 95# bilateral 20 x 1 10 x 1     Knee/Hip Exercises: Stretches   Active Hamstring Stretch Right;3 reps;30 seconds   Active Hamstring Stretch Limitations long sitting and heel on floor.    Other Knee/Hip Stretches slant board stretch 3 x30 sec     Knee/Hip Exercises: Aerobic   Recumbent Bike L4 for 6 min fastpace      Knee/Hip Exercises: Standing   Heel Raises Right;1 set;10 reps   Terminal Knee Extension Limitations green band parallel and with RLE forward - poor motor control, instability evident.    Lateral Step Up 20 reps;Hand Hold: 1;Step Height: 6"   Forward Step Up Right;20 reps;Hand Hold: 1;Step Height: 6"   Rebounder SLS RLE 4 toss best with red ball. Tandem stance RLE back 20 tosses without LOB, SLS with opposite LE on dyna disc  x 20      Cryotherapy   Number Minutes Cryotherapy 15 Minutes   Cryotherapy Location Knee   Type of Cryotherapy Ice pack                 PT Education - 12/11/15 1117    Education provided Yes   Education Details HEP   Person(s) Educated Patient   Methods Explanation;Handout   Comprehension Verbalized understanding          PT Short Term Goals - 11/28/15 1113      PT SHORT TERM GOAL #1   Title Pt will be I with initial HEP for Rt. LE    Status Achieved     PT SHORT TERM GOAL #2   Title Pt will be able to walk in community with 1 crutch and min increase in pain.    Status Achieved     PT SHORT TERM GOAL #3   Title Pt will complete FOTO and set goal.    Status Achieved     PT SHORT TERM GOAL #4   Title Pt will increase AROM in Rt. knee flexion to 115 deg    Status Achieved     PT SHORT TERM GOAL #5   Title Pt will demo R ankle strength to 4/5 throughout  or greater to improve gait stability   Status Achieved           PT Long Term Goals - 12/09/15 1124      PT LONG TERM GOAL #1   Title Pt will be I with concepts of RICE, more advanced LE HEP.    Baseline uses RICE at home,  HEP on going.  Some decreased compliance due to return to work/life activities   Time 8   Period Weeks   Status Partially Met     PT LONG TERM GOAL #2   Title Pt will demo Rt. knee AROM to -3 to 120 deg or better for transfers and normalized gait.    Baseline 5-128   Period Weeks   Status Partially Met     PT LONG TERM GOAL #3   Title Pt will demo 4+/5 strength in Rt. LE in major muscle groups for normalized gait.    Time 8   Period Weeks   Status On-going     PT LONG TERM GOAL #4   Title Pt will walk as needed in the community without pain, min limp and without device.    Baseline cane, single crutch in community, flat surfaces, antalgic   Time 8   Period Weeks   Status On-going     PT LONG TERM GOAL #5   Title FOTO score will be 37% impaired or less to demo functional improvement. .     Baseline 45% limitation 11/17/2015   Status Unable to assess               Plan - 12/11/15 1108     Clinical Impression Statement Pt reports more catching in medial posterior knee with some pain associated. He has difficulty maintaining fully extended knee in weight bearing on right. Worked on Terminal knee extension with weight shifted onto RLE. Pt demonstrates poor motor control with this exercises. Added to HEP. Increased soreness however no increased pain.    PT Next Visit Plan review new HEP   PT Home Exercise Plan hamstring curls and LAQ with yellow band, bridge, wall squat, heel raise, 4 way ankle T band and level 1 knee    Consulted and Agree with Plan of Care Patient      Patient will benefit from skilled therapeutic intervention in order to improve the following deficits and impairments:  Abnormal gait, Decreased range of motion, Difficulty walking, Increased fascial restricitons, Decreased activity tolerance, Pain, Impaired flexibility, Decreased scar mobility, Decreased balance, Decreased mobility, Decreased strength, Increased edema, Impaired sensation, Postural dysfunction  Visit Diagnosis: Difficulty in walking, not elsewhere classified  Localized edema  Muscle weakness (generalized)  Stiffness of right knee, not elsewhere classified     Problem List Patient Active Problem List   Diagnosis Date Noted  . Tremor 10/17/2013    Dorene Ar, PTA 12/11/2015, 11:23 AM  [place], [place], [address] Phone: [phone]   Fax:  [phone]  Name: Samuel Blankenship MRN: [id number] Date of Birth: 1995-02-24

## 2015-12-11 NOTE — Patient Instructions (Signed)
Terminal Knee Extension (Standing)    Facing anchor with right knee slightly bent and tubing just above knee, gently pull knee back straight. Do not overextend knee. Hold 5 seconds. Also Try with Right leg forward.  Repeat __10__ times per set. Do ___2_ sets per session. Do __2__ sessions per day.  http://orth.exer.us/666   Copyright  VHI. All rights reserved.

## 2015-12-15 ENCOUNTER — Ambulatory Visit: Payer: 59 | Admitting: Physical Therapy

## 2015-12-17 ENCOUNTER — Ambulatory Visit: Payer: 59 | Admitting: Physical Therapy

## 2015-12-17 DIAGNOSIS — M25661 Stiffness of right knee, not elsewhere classified: Secondary | ICD-10-CM

## 2015-12-17 DIAGNOSIS — R600 Localized edema: Secondary | ICD-10-CM

## 2015-12-17 DIAGNOSIS — M6281 Muscle weakness (generalized): Secondary | ICD-10-CM

## 2015-12-17 DIAGNOSIS — M25671 Stiffness of right ankle, not elsewhere classified: Secondary | ICD-10-CM | POA: Diagnosis not present

## 2015-12-17 DIAGNOSIS — R262 Difficulty in walking, not elsewhere classified: Secondary | ICD-10-CM

## 2015-12-17 NOTE — Therapy (Addendum)
[facility] [address] [place], [place], [address] Phone: [phone]   Fax:  [phone]  Physical Therapy Treatment  Patient Details  Name: Samuel Blankenship MRN: [id number] Date of Birth: 09/21/1995 Referring Provider: Murphy   Encounter Date: 12/17/2015      PT End of Session - 12/17/15 1055    Visit Number 17   Number of Visits 24   Date for PT Re-Evaluation 12/23/15   PT Start Time 0937   PT Stop Time 1025   PT Time Calculation (min) 48 min      Past Medical History:  Diagnosis Date  . Asthma   . Eye twitch     Past Surgical History:  Procedure Laterality Date  . None      There were no vitals filed for this visit.      Subjective Assessment - 12/17/15 0949    Subjective     Currently in Pain? No/denies            [facility] PT Assessment - 12/17/15 0001      Strength   Right/Left Hip Right;Left   Right Hip Extension 3+/5   Right Hip ABduction 4+/5   Right Hip ADduction 4-/5   Left Hip Extension 4-/5   Left Hip ABduction 4+/5   Left Hip ADduction 4-/5                     [facility] Adult PT Treatment/Exercise - 12/17/15 0001      Lumbar Exercises: Machines for Strengthening   Cybex Knee Extension 15# 10 X 2   Cybex Knee Flexion 15# right only 10 x 2      Knee/Hip Exercises: Stretches   Other Knee/Hip Stretches slant board stretch 3 x30 sec     Knee/Hip Exercises: Aerobic   Recumbent Bike L4 for 5 min fastpace      Knee/Hip Exercises: Standing   Heel Raises Right;1 set;10 reps   Terminal Knee Extension Limitations green band parallel and with RLE forward , back and parallell x 15 each   Lateral Step Up 20 reps;Hand Hold: 1;Step Height: 6"   Forward Step Up Right;20 reps;Hand Hold: 1;Step Height: 6"   Step Down 10 reps;1 set;Hand Hold: 1;Step Height: 4"   Rebounder SLS RLE 4 toss best with red ball. Tandem stance RLE back 20 tosses without LOB, SLS with opposite LE on dyna disc   x 20    Other Standing Knee Exercises weight on stance leg with forward step x 10, lateral step x 10      Cryotherapy   Number Minutes Cryotherapy 10 Minutes   Cryotherapy Location Knee   Type of Cryotherapy Ice pack                PT Education - 12/17/15 1054    Education provided Yes   Education Details HEP   Person(s) Educated Patient   Methods Explanation;Handout   Comprehension Verbalized understanding          PT Short Term Goals - 11/28/15 1113      PT SHORT TERM GOAL #1   Title Pt will be I with initial HEP for Rt. LE    Status Achieved     PT SHORT TERM GOAL #2   Title Pt will be able to walk in community with 1 crutch and min increase in pain.    Status Achieved     PT SHORT TERM GOAL #3   Title Pt will   complete FOTO and set goal.    Status Achieved     PT SHORT TERM GOAL #4   Title Pt will increase AROM in Rt. knee flexion to 115 deg    Status Achieved     PT SHORT TERM GOAL #5   Title Pt will demo R ankle strength to 4/5 throughout or greater to improve gait stability   Status Achieved           PT Long Term Goals - 12/17/15 1010      PT LONG TERM GOAL #1   Title Pt will be I with concepts of RICE, more advanced LE HEP.    Baseline uses RICE at home,  HEP on going.  Some decreased compliance due to return to work/life activities   Time 8   Status Partially Met     PT LONG TERM GOAL #2   Title Pt will demo Rt. knee AROM to -3 to 120 deg or better for transfers and normalized gait.    Baseline 0-132   Time 8   Period Weeks   Status Achieved     PT LONG TERM GOAL #3   Title Pt will demo 4+/5 strength in Rt. LE in major muscle groups for normalized gait.    Baseline Still weak hip extensors,  adductors, and knee   Time 8   Period Weeks   Status Partially Met     PT LONG TERM GOAL #4   Title Pt will walk as needed in the community without pain, min limp and without device.    Baseline cane with limp    Time 8   Period Weeks    Status On-going     PT LONG TERM GOAL #5   Title FOTO score will be 37% impaired or less to demo functional improvement. .     Baseline 33% limited 12/11/2015   Time 8   Period Weeks   Status Achieved               Plan - 12/17/15 1036    Clinical Impression Statement pt reports less cramping. He has been working on gait. Still using cane outside of home. He domstrates bilateral glute weakness. His Knee AROM has improved. He is progressing toward goals. LTG# 2, #5 MET.    PT Next Visit Plan SLS exercises, quad and hip strength, step exercises, gait   PT Home Exercise Plan hamstring curls and LAQ with yellow band, bridge, wall squat, heel raise, 4 way ankle T band and level 1 knee, green band TKE, single leg glute bridge      Patient will benefit from skilled therapeutic intervention in order to improve the following deficits and impairments:  Abnormal gait, Decreased range of motion, Difficulty walking, Increased fascial restricitons, Decreased activity tolerance, Pain, Impaired flexibility, Decreased scar mobility, Decreased balance, Decreased mobility, Decreased strength, Increased edema, Impaired sensation, Postural dysfunction  Visit Diagnosis: Localized edema  Muscle weakness (generalized)  Stiffness of right knee, not elsewhere classified  Difficulty in walking, not elsewhere classified     Problem List Patient Active Problem List   Diagnosis Date Noted  . Tremor 10/17/2013    Dorene Ar, PTA 12/17/2015, 10:57 AM  [facility] [address] [place], [place], [address] Phone: [phone]   Fax:  [phone]  Name: Samuel Blankenship MRN: [id number] Date of Birth: 04-Sep-1995

## 2015-12-17 NOTE — Patient Instructions (Signed)
Bridging (Single Leg)    Lie on back with feet shoulder width apart and right leg straight. Lift hips toward the ceiling while keeping leg straight. Hold ___5_ seconds. Repeat __10__ times. Do __2__ sessions per day.  http://gt2.exer.us/358   Copyright  VHI. All rights reserved.

## 2015-12-22 ENCOUNTER — Ambulatory Visit: Payer: 59 | Admitting: Physical Therapy

## 2015-12-22 DIAGNOSIS — R600 Localized edema: Secondary | ICD-10-CM | POA: Diagnosis not present

## 2015-12-22 DIAGNOSIS — R262 Difficulty in walking, not elsewhere classified: Secondary | ICD-10-CM

## 2015-12-22 DIAGNOSIS — M6281 Muscle weakness (generalized): Secondary | ICD-10-CM | POA: Diagnosis not present

## 2015-12-22 DIAGNOSIS — M25671 Stiffness of right ankle, not elsewhere classified: Secondary | ICD-10-CM

## 2015-12-22 DIAGNOSIS — M25661 Stiffness of right knee, not elsewhere classified: Secondary | ICD-10-CM | POA: Diagnosis not present

## 2015-12-22 NOTE — Addendum Note (Signed)
Addended by: Karie MainlandPAA, JENNIFER L on: 12/22/2015 12:33 PM   Modules accepted: Orders

## 2015-12-22 NOTE — Therapy (Signed)
[place], [place], [address] Phone: [phone]   Fax:  [phone]  Physical Therapy Treatment  Patient Details  Name: Samuel Blankenship MRN: [id number] Date of Birth: Dec 19, 1995 Referring Provider: Percell Miller   Encounter Date: 12/22/2015      PT End of Session - 12/22/15 1056    Visit Number 18   Number of Visits 24   Date for PT Re-Evaluation 12/29/15   PT Start Time 1021   PT Stop Time 1115   PT Time Calculation (min) 54 min   Activity Tolerance Patient tolerated treatment well   Behavior During Therapy [facility] for tasks assessed/performed      Past Medical History:  Diagnosis Date  . Asthma   . Eye twitch     Past Surgical History:  Procedure Laterality Date  . None      There were no vitals filed for this visit.          [facility] PT Assessment - 12/22/15 1027      AROM   Right Knee Extension 0   Right Knee Flexion 130   Right Ankle Dorsiflexion 5     Strength   Right Hip Extension 3+/5   Right Hip ABduction 4+/5   Right Hip ADduction 4-/5   Left Hip Extension 4-/5   Left Hip ABduction 4+/5   Left Hip ADduction 4-/5   Right Knee Flexion 4/5   Right Knee Extension 4/5   Left Knee Flexion 5/5   Left Knee Extension 5/5              [facility] Adult PT Treatment/Exercise - 12/22/15 1035      Knee/Hip Exercises: Stretches   Active Hamstring Stretch Right;3 reps;30 seconds     Knee/Hip Exercises: Aerobic   Elliptical L1 resistance and Level 10 ramp   6 min , change directions 1/2 way     Knee/Hip Exercises: Standing   Terminal Knee Extension Limitations green band parallel and with RLE forward , back and parallell x 15 each   Lateral Step Up 20 reps;Hand Hold: 1;Step Height: 6"   Forward Step Up Right;20 reps;Hand Hold: 1;Step Height: 6"   Step Down 10 reps;1 set;Hand Hold: 1;Step Height: 4"   Wall Squat 1 set;10 reps   Wall Squat Limitations 5 sec hold      Knee/Hip  Exercises: Supine   Bridges Strengthening;Right;Both;2 sets   Bridges Limitations maintaining R LE ext    Straight Leg Raises Strengthening;Right;1 set;20 reps   Straight Leg Raise with External Rotation Strengthening;Right;1 set;10 reps     Cryotherapy   Number Minutes Cryotherapy 10 Minutes   Cryotherapy Location Knee   Type of Cryotherapy Ice pack                  PT Short Term Goals - 11/28/15 1113      PT SHORT TERM GOAL #1   Title Pt will be I with initial HEP for Rt. LE    Status Achieved     PT SHORT TERM GOAL #2   Title Pt will be able to walk in community with 1 crutch and min increase in pain.    Status Achieved     PT SHORT TERM GOAL #3   Title Pt will complete FOTO and set goal.    Status Achieved     PT SHORT TERM GOAL #4   Title Pt will increase AROM in Rt. knee flexion to 115 deg  Status Achieved     PT SHORT TERM GOAL #5   Title Pt will demo R ankle strength to 4/5 throughout or greater to improve gait stability   Status Achieved           PT Long Term Goals - 12/22/15 1044      PT LONG TERM GOAL #1   Title Pt will be I with concepts of RICE, more advanced LE HEP.    Status Achieved     PT LONG TERM GOAL #2   Title Pt will demo Rt. knee AROM to -3 to 120 deg or better for transfers and normalized gait.    Status Achieved     PT LONG TERM GOAL #3   Title Pt will demo 4+/5 strength in Rt. LE in major muscle groups for normalized gait.    Status Achieved     PT LONG TERM GOAL #4   Title Pt will walk as needed in the community without pain, min limp and without device.    Baseline uses cane for longer distances and when walking on uneven ground    Status On-going     PT LONG TERM GOAL #5   Title FOTO score will be 37% impaired or less to demo functional improvement. .     Status Achieved               Plan - 12/22/15 1228    Clinical Impression Statement Pt has met all LTGs but requests one more visit to review full HEP  and streamline.  He cont with Rt. LE weakness but feels he can work on at home.  He is not functionally limited in his daily activities due to pain.  He needs cane only if he is standing, walking longer distances.     PT Next Visit Plan full HEP review and streamline.  Elliptical. Strength.  FOTO DC    PT Home Exercise Plan hamstring curls and LAQ with yellow band, bridge, wall squat, heel raise, 4 way ankle T band and level 1 knee, green band TKE, single leg glute bridge   Consulted and Agree with Plan of Care Patient      Patient will benefit from skilled therapeutic intervention in order to improve the following deficits and impairments:  Abnormal gait, Decreased range of motion, Difficulty walking, Increased fascial restricitons, Decreased activity tolerance, Pain, Impaired flexibility, Decreased scar mobility, Decreased balance, Decreased mobility, Decreased strength, Increased edema, Impaired sensation, Postural dysfunction  Visit Diagnosis: Localized edema  Muscle weakness (generalized)  Stiffness of right knee, not elsewhere classified  Difficulty in walking, not elsewhere classified  Stiffness of right ankle, not elsewhere classified     Problem List Patient Active Problem List   Diagnosis Date Noted  . Tremor 10/17/2013    PAA,JENNIFER 12/22/2015, 12:31 PM  [facility] [address] [place], [place], [address] Phone: [phone]   Fax:  [phone]  Name: Samuel Blankenship MRN: [id number] Date of Birth: 1995/12/03  Raeford Razor, PT 12/22/15 12:31 PM Phone: [phone] Fax: [phone]

## 2015-12-24 ENCOUNTER — Ambulatory Visit: Payer: 59 | Admitting: Physical Therapy

## 2015-12-24 DIAGNOSIS — M25671 Stiffness of right ankle, not elsewhere classified: Secondary | ICD-10-CM | POA: Diagnosis not present

## 2015-12-24 DIAGNOSIS — M25661 Stiffness of right knee, not elsewhere classified: Secondary | ICD-10-CM

## 2015-12-24 DIAGNOSIS — M6281 Muscle weakness (generalized): Secondary | ICD-10-CM

## 2015-12-24 DIAGNOSIS — R262 Difficulty in walking, not elsewhere classified: Secondary | ICD-10-CM

## 2015-12-24 DIAGNOSIS — R600 Localized edema: Secondary | ICD-10-CM

## 2015-12-24 NOTE — Patient Instructions (Signed)
Hip Extension (Prone)   Lift left leg _6___ inches from floor, keeping knee locked. Repeat ___10  times per set. Do _2___ sets per session. Do ___2_ sessions per day.  http://orth.exer.us/98   Copyright  VHI. All rights reserved.  Hip Adduction: Leg Lift (Eccentric) - Side-Lying   Lie on side with top leg bent, foot flat behind lower leg. Quickly lift lower leg. Slowly lower for 3-5 seconds. __10_ reps per set, __2_ sets per day, _7__ days per week.   Copyright  VHI. All rights reserved.  Abduction: Side Leg Lift (Eccentric) - Side-Lying   Lie on side. Lift top leg slightly higher than shoulder level. Keep top leg straight with body, toes pointing forward. Slowly lower for 3-5 seconds. 10__ reps per set, _2__ sets per day, __7_ days per week.   Copyright  VHI. All rights reserved.  Strengthening: Straight Leg Raise (Phase 1)   Tighten muscles on front of right thigh, then lift leg ___12_ inches from surface, keeping knee locked.  Repeat __10__ times per set. Do __2__ sets per session. Do __2__ sessions per day.  http://orth.exer.us/614   Copyright  VHI. All rights reserved.

## 2015-12-24 NOTE — Therapy (Addendum)
[place], [place], [address] Phone: [phone]   Fax:  [phone]  Physical Therapy Treatment/Discharge  Patient Details  Name: Samuel Blankenship MRN: [id number] Date of Birth: [birth year] Referring Provider: Percell Miller   Encounter Date: 12/24/2015      PT End of Session - 12/24/15 1025    Visit Number 19   Number of Visits 24   Date for PT Re-Evaluation 12/29/15   PT Start Time 1025  10 minutes late   PT Stop Time 1110   PT Time Calculation (min) 45 min      Past Medical History:  Diagnosis Date  . Asthma   . Eye twitch     Past Surgical History:  Procedure Laterality Date  . None      There were no vitals filed for this visit.          [facility] PT Assessment - 12/24/15 0001      Observation/Other Assessments   Focus on Therapeutic Outcomes (FOTO)  32% limited improved from 62 % limited on eval      AROM   Right Knee Extension 0   Right Knee Flexion 130   Right Ankle Dorsiflexion 5     Strength   Right Hip Extension 3+/5   Right Hip ABduction 4+/5   Right Hip ADduction 4-/5   Left Hip Extension 4-/5   Left Hip ABduction 4+/5   Left Hip ADduction 4-/5   Right Knee Flexion 4/5   Right Knee Extension 4/5   Left Knee Flexion 5/5   Left Knee Extension 5/5                     [facility] Adult PT Treatment/Exercise - 12/24/15 0001      Knee/Hip Exercises: Stretches   Other Knee/Hip Stretches slant board stretch 3 x30 sec     Knee/Hip Exercises: Aerobic   Recumbent Bike L4 for 5 min fastpace      Knee/Hip Exercises: Standing   Terminal Knee Extension Limitations green band parallel and with RLE forward , back and parallell x 15 each   Wall Squat 1 set;10 reps   Wall Squat Limitations 5 sec hold    Other Standing Knee Exercises weight on stance leg with forward step x 10, lateral step x 10      Knee/Hip Exercises: Supine   Bridges Strengthening;Right;Both;2 sets   Bridges Limitations maintaining R LE ext    Straight Leg Raises Strengthening;Right;1 set;20 reps     Knee/Hip Exercises: Sidelying   Hip ABduction 2 sets;10 reps   Hip ADduction 2 sets;10 reps     Knee/Hip Exercises: Prone   Hip Extension 2 sets;10 reps     Cryotherapy   Number Minutes Cryotherapy 10 Minutes   Cryotherapy Location Knee   Type of Cryotherapy Ice pack                PT Education - 12/24/15 1055    Education provided Yes   Education Details Reprinted entire HEP   Person(s) Educated Patient   Methods Explanation;Handout   Comprehension Verbalized understanding          PT Short Term Goals - 11/28/15 1113      PT SHORT TERM GOAL #1   Title Pt will be I with initial HEP for Rt. LE    Status Achieved     PT SHORT TERM GOAL #2   Title Pt will be able to walk in community  with 1 crutch and min increase in pain.    Status Achieved     PT SHORT TERM GOAL #3   Title Pt will complete FOTO and set goal.    Status Achieved     PT SHORT TERM GOAL #4   Title Pt will increase AROM in Rt. knee flexion to 115 deg    Status Achieved     PT SHORT TERM GOAL #5   Title Pt will demo R ankle strength to 4/5 throughout or greater to improve gait stability   Status Achieved           PT Long Term Goals - 12/24/15 1136      PT LONG TERM GOAL #1   Title Pt will be I with concepts of RICE, more advanced LE HEP.    Status Achieved     PT LONG TERM GOAL #2   Title Pt will demo Rt. knee AROM to -3 to 120 deg or better for transfers and normalized gait.    Status Achieved     PT LONG TERM GOAL #3   Title Pt will demo 4+/5 strength in Rt. LE in major muscle groups for normalized gait.    Baseline Still weak hip extensors,  adductors, and knee   Status Partially Met     PT LONG TERM GOAL #4   Title Pt will walk as needed in the community without pain, min limp and without device.    Baseline uses cane for longer distances and when walking on uneven ground     Time 8   Status Partially Met     PT LONG TERM GOAL #5   Title FOTO score will be 37% impaired or less to demo functional improvement. .     Baseline 32% limited    Status Achieved               Plan - 12/24/15 1115    Clinical Impression Statement Review of HEP. Pt is independent. All LTGs Met or partially Met. . Discharge today to HEP.    PT Next Visit Plan discharge today    PT Home Exercise Plan hamstring curls and LAQ with yellow band, bridge, wall squat, heel raise, 4 way ankle T band and level 1 knee, green band TKE, single leg glute bridge; 4 way hip    Consulted and Agree with Plan of Care Patient      Patient will benefit from skilled therapeutic intervention in order to improve the following deficits and impairments:  Abnormal gait, Decreased range of motion, Difficulty walking, Increased fascial restricitons, Decreased activity tolerance, Pain, Impaired flexibility, Decreased scar mobility, Decreased balance, Decreased mobility, Decreased strength, Increased edema, Impaired sensation, Postural dysfunction  Visit Diagnosis: Localized edema  Muscle weakness (generalized)  Stiffness of right knee, not elsewhere classified  Difficulty in walking, not elsewhere classified  Stiffness of right ankle, not elsewhere classified     Problem List Patient Active Problem List   Diagnosis Date Noted  . Tremor 10/17/2013    Dorene Ar, PTA 12/24/2015, 11:38 AM  [facility] [address] [place], [place], [address] Phone: [phone]   Fax:  [phone]  Name: Samuel Blankenship MRN: [id number] Date of Birth: 04/24/1995  PHYSICAL THERAPY DISCHARGE SUMMARY  Visits from Start of Care: 19  Current functional level related to goals / functional outcomes: See above for goals met.  Continues to have muscle weakness and endurance deficits in Rt. LE  Remaining deficits: See above    Education /  Equipment: HEP, posture, gait, RICE, stretching techniques Plan: Patient agrees to discharge.  Patient goals were partially met. Patient is being discharged due to being pleased with the current functional level.  ?????     Raeford Razor, PT 12/29/15 10:52 AM Phone: [phone] Fax: [phone]

## 2016-01-14 DIAGNOSIS — M79661 Pain in right lower leg: Secondary | ICD-10-CM | POA: Diagnosis not present

## 2016-01-14 MED FILL — traMADol HCL 50 MG TABS: 50 | 10 days supply | Qty: 40 | Fill #0

## 2016-03-17 DIAGNOSIS — M79661 Pain in right lower leg: Secondary | ICD-10-CM | POA: Diagnosis not present

## 2016-03-23 DIAGNOSIS — Z1389 Encounter for screening for other disorder: Secondary | ICD-10-CM | POA: Diagnosis not present

## 2016-03-23 DIAGNOSIS — Z8679 Personal history of other diseases of the circulatory system: Secondary | ICD-10-CM | POA: Diagnosis not present

## 2016-03-23 DIAGNOSIS — Z6830 Body mass index (BMI) 30.0-30.9, adult: Secondary | ICD-10-CM | POA: Diagnosis not present

## 2016-03-23 DIAGNOSIS — R200 Anesthesia of skin: Secondary | ICD-10-CM | POA: Diagnosis not present

## 2016-03-30 DIAGNOSIS — M79661 Pain in right lower leg: Secondary | ICD-10-CM | POA: Diagnosis not present

## 2016-04-05 DIAGNOSIS — M79661 Pain in right lower leg: Secondary | ICD-10-CM | POA: Diagnosis not present

## 2016-04-19 DIAGNOSIS — M25521 Pain in right elbow: Secondary | ICD-10-CM | POA: Diagnosis not present

## 2016-04-19 DIAGNOSIS — M79661 Pain in right lower leg: Secondary | ICD-10-CM | POA: Diagnosis not present

## 2016-04-20 ENCOUNTER — Emergency Department (HOSPITAL_COMMUNITY)
Admission: EM | Admit: 2016-04-20 | Discharge: 2016-04-21 | Disposition: A | Discharge status: Home / Self Care | Payer: 59 | Attending: Emergency Medicine | Admitting: Emergency Medicine

## 2016-04-20 ENCOUNTER — Encounter (HOSPITAL_COMMUNITY): Payer: Self-pay | Admitting: Emergency Medicine

## 2016-04-20 DIAGNOSIS — N1339 Other hydronephrosis: Secondary | ICD-10-CM | POA: Diagnosis not present

## 2016-04-20 DIAGNOSIS — R109 Unspecified abdominal pain: Secondary | ICD-10-CM | POA: Diagnosis not present

## 2016-04-20 DIAGNOSIS — Z79899 Other long term (current) drug therapy: Secondary | ICD-10-CM | POA: Insufficient documentation

## 2016-04-20 DIAGNOSIS — J45909 Unspecified asthma, uncomplicated: Secondary | ICD-10-CM | POA: Diagnosis not present

## 2016-04-20 DIAGNOSIS — N200 Calculus of kidney: Secondary | ICD-10-CM | POA: Diagnosis not present

## 2016-04-20 LAB — CBC WITH DIFFERENTIAL/PLATELET
Basophils Absolute: 0 10*3/uL (ref 0.0–0.1)
Basophils Relative: 0 %
Eosinophils Absolute: 0.2 10*3/uL (ref 0.0–0.7)
Eosinophils Relative: 2 %
HEMATOCRIT: 48.1 % (ref 39.0–52.0)
HEMOGLOBIN: 15.7 g/dL (ref 13.0–17.0)
LYMPHS ABS: 3.2 10*3/uL (ref 0.7–4.0)
LYMPHS PCT: 27 %
MCH: 27 pg (ref 26.0–34.0)
MCHC: 32.6 g/dL (ref 30.0–36.0)
MCV: 82.6 fL (ref 78.0–100.0)
MONOS PCT: 6 %
Monocytes Absolute: 0.7 10*3/uL (ref 0.1–1.0)
NEUTROS ABS: 7.7 10*3/uL (ref 1.7–7.7)
NEUTROS PCT: 65 %
Platelets: 322 10*3/uL (ref 150–400)
RBC: 5.82 MIL/uL — ABNORMAL HIGH (ref 4.22–5.81)
RDW: 14.9 % (ref 11.5–15.5)
WBC: 11.7 10*3/uL — ABNORMAL HIGH (ref 4.0–10.5)

## 2016-04-20 LAB — BASIC METABOLIC PANEL
Anion gap: 12 (ref 5–15)
BUN: 11 mg/dL (ref 6–20)
CHLORIDE: 105 mmol/L (ref 101–111)
CO2: 23 mmol/L (ref 22–32)
Calcium: 9.6 mg/dL (ref 8.9–10.3)
Creatinine, Ser: 0.94 mg/dL (ref 0.61–1.24)
GFR calc Af Amer: >60 mL/min (ref >60)
GFR calc non Af Amer: >60 mL/min (ref >60)
Glucose, Bld: 129 mg/dL — ABNORMAL HIGH (ref 65–99)
Potassium: 3.7 mmol/L (ref 3.5–5.1)
Sodium: 140 mmol/L (ref 135–145)

## 2016-04-20 MED ORDER — KETOROLAC TROMETHAMINE 30 MG/ML IJ SOLN
30.0000 mg | Freq: Once | INTRAMUSCULAR | Status: AC
  Administered 2016-04-20: 30 mg via INTRAVENOUS
  Filled 2016-04-20: qty 1

## 2016-04-20 MED ORDER — ONDANSETRON HCL 4 MG/2ML IJ SOLN
4.0000 mg | Freq: Once | INTRAMUSCULAR | Status: AC
  Administered 2016-04-20: 4 mg via INTRAVENOUS
  Filled 2016-04-20: qty 2

## 2016-04-20 MED ORDER — MORPHINE SULFATE (PF) 4 MG/ML IV SOLN
4.0000 mg | Freq: Once | INTRAVENOUS | Status: AC
  Administered 2016-04-20: 4 mg via INTRAVENOUS
  Filled 2016-04-20: qty 1

## 2016-04-20 NOTE — ED Triage Notes (Addendum)
Pt has hx of kidney stones.  Had sudden onset of left flank pain approx 1 hour ago.  Has vomited x's 1.  Pt's mom gave him a Percocet approx 30 mins ago. Pt pale and unable to sit still in triage.

## 2016-04-20 NOTE — ED Provider Notes (Signed)
[facility]-EMERGENCY DEPT Provider Note   CSN: [id number] Arrival date & time: 04/20/16  2257  By signing my name below, I, Majel HomerPeyton Lee, attest that this documentation has been prepared under the direction and in the presence of Tomasita CrumbleAdeleke Tadeo Besecker, MD . Electronically Signed: Majel HomerPeyton Lee, Scribe. 04/20/2016. 11:38 PM.  History   Chief Complaint Chief Complaint  Patient presents with  . Back Pain   The history is provided by the patient. No language interpreter was used.   HPI Comments: Samuel Blankenship is a 21 y.o. male who presents to the Emergency Department complaining of gradually worsening, left flank pain consistent with a previous kidney stone that began at ~10:00 PM this evening. Pt reports his pain is exacerbated with movement and is persistent even while lying still. He notes he took "leftover" 5 mg Oxycodone and experienced 1 episode of vomiting soon after. He states he then took another Oxycodone along with Zofran at ~10:45 PM with mild relief of his symptoms. Pt reports hx of similar symptoms ~5 years ago in which [facility] and received a CT scan which diagnosed a kidney stone in his right flank that passed on its own. Per mother, pt has maternal FHx of kidney stones. He denies any hematuria or decreased appetite.   Past Medical History:  Diagnosis Date  . Asthma   . Eye twitch   . Kidney stones     Patient Active Problem List   Diagnosis Date Noted  . Tremor 10/17/2013    Past Surgical History:  Procedure Laterality Date  . FRACTURE SURGERY    . None      Home Medications    Prior to Admission medications   Medication Sig Start Date End Date Taking? Authorizing Provider  ibuprofen (ADVIL,MOTRIN) 600 MG tablet Take 600 mg by mouth every 6 (six) hours as needed for moderate pain. Pain    Yes Historical Provider, MD  oxyCODONE (ROXICODONE) 5 MG immediate release tablet Take 1 tablet (5 mg total) by mouth 2 (two) times daily as needed for severe pain. 04/21/16   Tomasita CrumbleAdeleke Kassidy Frankson,  MD    Family History Family History  Problem Relation Age of Onset  . Multiple sclerosis Father   . Alzheimer's disease Paternal Grandmother     Social History Social History  Substance Use Topics  . Smoking status: Never Smoker  . Smokeless tobacco: Never Used  . Alcohol use 0.6 oz/week    1 Cans of beer per week     Comment: Once per month     Allergies   Penicillins and Zyrtec [cetirizine]   Review of Systems Review of Systems  Constitutional: Negative for appetite change.  Gastrointestinal: Positive for vomiting.  Genitourinary: Positive for flank pain. Negative for hematuria.  All other systems reviewed and are negative.  Physical Exam Updated Vital Signs BP (!) 136/56 (BP Location: Left Arm)   Pulse 70   Temp 97.4 F (36.3 C) (Oral)   Resp 20   Ht 5\' 11"  (1.803 m)   Wt 200 lb (90.7 kg)   SpO2 100%   BMI 27.89 kg/m   Physical Exam  Constitutional: He is oriented to person, place, and time. Vital signs are normal. He appears well-developed and well-nourished.  Non-toxic appearance. He does not appear ill. He appears distressed.  HENT:  Head: Normocephalic and atraumatic.  Nose: Nose normal.  Mouth/Throat: Oropharynx is clear and moist. No oropharyngeal exudate.  Eyes: Conjunctivae and EOM are normal. Pupils are equal, round, and reactive  to light. No scleral icterus.  Neck: Normal range of motion. Neck supple. No tracheal deviation, no edema, no erythema and normal range of motion present. No thyroid mass and no thyromegaly present.  Cardiovascular: Normal rate, regular rhythm, S1 normal, S2 normal, normal heart sounds, intact distal pulses and normal pulses.  Exam reveals no gallop and no friction rub.   No murmur heard. Pulmonary/Chest: Effort normal and breath sounds normal. No respiratory distress. He has no wheezes. He has no rhonchi. He has no rales.  Abdominal: Soft. Normal appearance and bowel sounds are normal. He exhibits no distension, no ascites  and no mass. There is no hepatosplenomegaly. There is no tenderness. There is no rebound, no guarding and no CVA tenderness.  LLQ is TTP, no CVA tenderness.   Musculoskeletal: Normal range of motion. He exhibits no edema or tenderness.  Lymphadenopathy:    He has no cervical adenopathy.  Neurological: He is alert and oriented to person, place, and time. He has normal strength. No cranial nerve deficit or sensory deficit.  Skin: Skin is warm, dry and intact. No petechiae and no rash noted. He is not diaphoretic. No erythema. No pallor.  Nursing note and vitals reviewed.  ED Treatments / Results  DIAGNOSTIC STUDIES:  Oxygen Saturation is 100% on RA, normal by my interpretation.    COORDINATION OF CARE:  11:26 PM Discussed treatment plan with pt at bedside and pt agreed to plan.  Labs (all labs ordered are listed, but only abnormal results are displayed) Labs Reviewed  CBC WITH DIFFERENTIAL/PLATELET - Abnormal; Notable for the following:       Result Value   WBC 11.7 (*)    RBC 5.82 (*)    All other components within normal limits  BASIC METABOLIC PANEL - Abnormal; Notable for the following:    Glucose, Bld 129 (*)    All other components within normal limits  URINE CULTURE  URINALYSIS, ROUTINE W REFLEX MICROSCOPIC    EKG  EKG Interpretation None      Radiology Ct Renal Stone Study  Result Date: 04/21/2016 CLINICAL DATA:  21 year old male EXAM: CT ABDOMEN AND PELVIS WITHOUT CONTRAST TECHNIQUE: Multidetector CT imaging of the abdomen and pelvis was performed following the standard protocol without IV contrast. COMPARISON:  Abdominal CT dated 12/23/2011 FINDINGS: Evaluation of this exam is limited in the absence of intravenous contrast. Lower chest: The visualized lung bases are clear. No intra-abdominal free air or free fluid. Hepatobiliary: Mild fatty infiltration of the liver. No intrahepatic biliary ductal dilatation. The gallbladder is unremarkable. Pancreas: Unremarkable.  No pancreatic ductal dilatation or surrounding inflammatory changes. Spleen: Normal in size without focal abnormality. Adrenals/Urinary Tract: The adrenal glands are unremarkable. Multiple small nonobstructing bilateral renal calculi measure up to 2 mm. There is a 4 mm left ureteropelvic junction stone. There is mild left pelvocaliectasis. The right ureter is unremarkable. The urinary bladder is only partially distended and grossly unremarkable. Stomach/Bowel: There is moderate stool throughout the colon. No evidence of bowel obstruction or active inflammation. Normal appendix. Vascular/Lymphatic: The abdominal aorta and IVC are grossly unremarkable on this noncontrast study. No portal venous gas identified. There is no adenopathy. Reproductive: The prostate and seminal vesicles are grossly unremarkable. Other: None Musculoskeletal: No acute or significant osseous findings. IMPRESSION: 1. A 4 mm left UPJ stone with mild left hydronephrosis. Multiple other tiny nonobstructing bilateral renal calculi noted. 2. Mild fatty liver. 3. No bowel obstruction or active inflammation.  Normal appendix. Electronically Signed   By: Burtis Junes  Radparvar M.D.   On: 04/21/2016 00:49    Procedures Procedures (including critical care time)  Medications Ordered in ED Medications  ketorolac (TORADOL) 30 MG/ML injection 30 mg (30 mg Intravenous Given 04/20/16 2339)  morphine 4 MG/ML injection 4 mg (4 mg Intravenous Given 04/20/16 2339)  ondansetron (ZOFRAN) injection 4 mg (4 mg Intravenous Given 04/20/16 2339)    Initial Impression / Assessment and Plan / ED Course  I have reviewed the triage vital signs and the nursing notes.  Pertinent labs & imaging results that were available during my care of the patient were reviewed by me and considered in my medical decision making (see chart for details).     Patient presents to the ED for flank pain, history concerning for nephrolithiasis.  He was given morphine and toradol for  pain control. Also zofran for nausea.  Will obtain labs and CT scan.     Cr is normal.  He states his pain has improved after medications. CT is pending.  CT reveal 4mm stone at R UPJ.  Patietn educated and advised to see urology within 3 days. Referral given. He appears well and in NAD.  Oxycodone at home for severe pain only.  VS remain within his normal limits and he is safe for DC.   I personally performed the services described in this documentation, which was scribed in my presence. The recorded information has been reviewed and is accurate.     Final Clinical Impressions(s) / ED Diagnoses   Final diagnoses:  Right flank pain  Nephrolithiasis    New Prescriptions New Prescriptions   OXYCODONE (ROXICODONE) 5 MG IMMEDIATE RELEASE TABLET    Take 1 tablet (5 mg total) by mouth 2 (two) times daily as needed for severe pain.     Tomasita Crumble, MD 04/21/16 [phone]

## 2016-04-21 ENCOUNTER — Other Ambulatory Visit: Payer: Self-pay

## 2016-04-21 ENCOUNTER — Emergency Department (HOSPITAL_COMMUNITY): Payer: 59

## 2016-04-21 DIAGNOSIS — N201 Calculus of ureter: Secondary | ICD-10-CM | POA: Diagnosis not present

## 2016-04-21 DIAGNOSIS — Z79899 Other long term (current) drug therapy: Secondary | ICD-10-CM | POA: Diagnosis not present

## 2016-04-21 DIAGNOSIS — J45909 Unspecified asthma, uncomplicated: Secondary | ICD-10-CM | POA: Diagnosis not present

## 2016-04-21 DIAGNOSIS — N200 Calculus of kidney: Secondary | ICD-10-CM | POA: Diagnosis not present

## 2016-04-21 DIAGNOSIS — N1330 Unspecified hydronephrosis: Secondary | ICD-10-CM | POA: Diagnosis not present

## 2016-04-21 DIAGNOSIS — K760 Fatty (change of) liver, not elsewhere classified: Secondary | ICD-10-CM | POA: Diagnosis not present

## 2016-04-21 MED ORDER — OXYCODONE-ACETAMINOPHEN 5-325 MG PO TABS
1.0000 | ORAL_TABLET | Freq: Once | ORAL | Status: AC
  Administered 2016-04-21: 1 via ORAL
  Filled 2016-04-21: qty 1

## 2016-04-21 MED ORDER — PROMETHAZINE HCL 25 MG PO TABS: 25.0000 mg | ORAL_TABLET | Freq: Three times a day (TID) | ORAL | 0 refills | Status: AC | PRN

## 2016-04-21 MED ORDER — OXYCODONE HCL 5 MG PO TABS: 5.0000 mg | ORAL_TABLET | Freq: Two times a day (BID) | ORAL | 0 refills | Status: DC | PRN

## 2016-04-21 MED FILL — ONDANSETRON HCL 4 MG TABLET: 4 | 7 days supply | Qty: 20 | Fill #0

## 2016-04-21 MED FILL — TAMSULOSIN HCL 0.4 MG CAP: 0.4 | 14 days supply | Qty: 14 | Fill #0

## 2016-04-21 MED FILL — PROMETHAZINE 25 MG TABLET: 25 | 7 days supply | Qty: 20 | Fill #0

## 2016-04-21 MED FILL — OXYCODONE W/APAP 5/325 TAB: 5-325 | 3 days supply | Qty: 25 | Fill #0

## 2016-05-10 DIAGNOSIS — N202 Calculus of kidney with calculus of ureter: Secondary | ICD-10-CM | POA: Diagnosis not present

## 2016-05-24 DIAGNOSIS — H5213 Myopia, bilateral: Secondary | ICD-10-CM | POA: Diagnosis not present

## 2016-06-11 DIAGNOSIS — N200 Calculus of kidney: Secondary | ICD-10-CM | POA: Diagnosis not present

## 2016-07-01 MED FILL — INDAPAMIDE 2.5 MG TABLET: 2.5 | 30 days supply | Qty: 30 | Fill #0

## 2016-07-09 DIAGNOSIS — M545 Low back pain: Secondary | ICD-10-CM | POA: Diagnosis not present

## 2016-07-09 DIAGNOSIS — N132 Hydronephrosis with renal and ureteral calculous obstruction: Secondary | ICD-10-CM | POA: Diagnosis not present

## 2016-07-09 DIAGNOSIS — N200 Calculus of kidney: Secondary | ICD-10-CM | POA: Diagnosis not present

## 2016-07-09 DIAGNOSIS — Z87442 Personal history of urinary calculi: Secondary | ICD-10-CM | POA: Diagnosis not present

## 2016-07-09 DIAGNOSIS — F17290 Nicotine dependence, other tobacco product, uncomplicated: Secondary | ICD-10-CM | POA: Diagnosis not present

## 2016-07-09 DIAGNOSIS — R109 Unspecified abdominal pain: Secondary | ICD-10-CM | POA: Diagnosis not present

## 2016-07-09 DIAGNOSIS — R1032 Left lower quadrant pain: Secondary | ICD-10-CM | POA: Diagnosis not present

## 2016-07-16 DIAGNOSIS — R8271 Bacteriuria: Secondary | ICD-10-CM | POA: Diagnosis not present

## 2016-07-16 DIAGNOSIS — Z79899 Other long term (current) drug therapy: Secondary | ICD-10-CM | POA: Diagnosis not present

## 2016-07-16 DIAGNOSIS — R1084 Generalized abdominal pain: Secondary | ICD-10-CM | POA: Diagnosis not present

## 2016-07-16 DIAGNOSIS — Z87442 Personal history of urinary calculi: Secondary | ICD-10-CM | POA: Diagnosis not present

## 2016-07-16 DIAGNOSIS — R1012 Left upper quadrant pain: Secondary | ICD-10-CM | POA: Diagnosis not present

## 2016-07-16 DIAGNOSIS — N202 Calculus of kidney with calculus of ureter: Secondary | ICD-10-CM | POA: Diagnosis not present

## 2016-07-16 DIAGNOSIS — R1032 Left lower quadrant pain: Secondary | ICD-10-CM | POA: Diagnosis not present

## 2016-07-16 DIAGNOSIS — N398 Other specified disorders of urinary system: Secondary | ICD-10-CM | POA: Diagnosis not present

## 2016-07-16 DIAGNOSIS — F17290 Nicotine dependence, other tobacco product, uncomplicated: Secondary | ICD-10-CM | POA: Diagnosis not present

## 2016-07-16 MED FILL — oxyCODONE HCL 5 MG TABS: 5 | 5 days supply | Qty: 20 | Fill #0

## 2016-07-27 MED FILL — TAMSULOSIN HCL 0.4 MG CAP: 0.4 | 30 days supply | Qty: 30 | Fill #0

## 2016-07-27 MED FILL — OXYCODONE W/APAP 5/325 TAB: 5-325 | 3 days supply | Qty: 20 | Fill #0

## 2016-07-30 DIAGNOSIS — N23 Unspecified renal colic: Secondary | ICD-10-CM | POA: Diagnosis not present

## 2016-07-30 DIAGNOSIS — F17210 Nicotine dependence, cigarettes, uncomplicated: Secondary | ICD-10-CM | POA: Diagnosis not present

## 2016-08-13 DIAGNOSIS — R319 Hematuria, unspecified: Secondary | ICD-10-CM | POA: Diagnosis not present

## 2016-08-13 DIAGNOSIS — N202 Calculus of kidney with calculus of ureter: Secondary | ICD-10-CM | POA: Diagnosis not present

## 2016-08-13 DIAGNOSIS — R8271 Bacteriuria: Secondary | ICD-10-CM | POA: Diagnosis not present

## 2016-08-13 DIAGNOSIS — N200 Calculus of kidney: Secondary | ICD-10-CM | POA: Diagnosis not present

## 2016-08-13 MED FILL — KETOROLAC 10 MG TABLET: 10 | 8 days supply | Qty: 20 | Fill #0

## 2016-08-17 MED FILL — INDAPAMIDE 2.5 MG TABLET: 2.5 | 30 days supply | Qty: 30 | Fill #1

## 2016-08-19 ENCOUNTER — Other Ambulatory Visit: Payer: Self-pay | Admitting: Urology

## 2016-08-26 ENCOUNTER — Encounter (HOSPITAL_COMMUNITY): Payer: Self-pay

## 2016-08-26 NOTE — Patient Instructions (Signed)
Samuel LeiterDavid S Blankenship  08/26/2016   Your procedure is scheduled on: 09/02/16   [facility]  [place] [place] 3rd floor to  Short Stay Center at    12:30 AM.    Call this number if you have problems the morning of surgery [phone]   Remember: ONLY 1 PERSON MAY GO WITH YOU TO SHORT STAY TO GET  READY MORNING OF YOUR SURGERY.  Do not eat food  :After Midnight.  YOU MAY HAVE CLEAR LIQUIDS UNTIL 0830 AM THEN NOTHING BY MOUTH     Take these medicines the morning of surgery with A SIP OF WATER: none                                You may not have any metal on your body including hair pins and              piercings  Do not wear jewelry,, lotions, powders or perfumes, deodorant                         Men may shave face and neck.   Do not bring valuables to the hospital. [facility] IS NOT             RESPONSIBLE   FOR VALUABLES.  Contacts, dentures or bridgework may not be worn into surgery.       Patients discharged the day of surgery will not be allowed to drive home.  Name and phone number of your driver:  Special Instructions: N/A              Please read over the following fact sheets you were given: _____________________________________________________________________             [facility] - Preparing [facility] Before surgery, you can play an important role.  Because skin is not sterile, your skin needs to be as free of germs as possible.  You can reduce the number of germs on your skin by washing with CHG (chlorahexidine gluconate) soap before surgery.  CHG is an antiseptic cleaner which kills germs and bonds with the skin to continue killing germs even after washing. Please DO NOT use if you have an allergy to CHG or antibacterial soaps.  If your skin becomes reddened/irritated stop using the CHG and inform your nurse when you arrive at Short Stay. Do not shave (including legs and underarms) for at least 48 hours prior  to the first CHG shower.  You may shave your face/neck. Please follow these instructions carefully:  1.  Shower with CHG Soap the night before surgery and the  morning of Surgery.  2.  If you choose to wash your hair, wash your hair first as usual with your  normal  shampoo.  3.  After you shampoo, rinse your hair and body thoroughly to remove the  shampoo.                           4.  Use CHG as you would any other liquid soap.  You can apply chg directly  to the skin and wash                       Gently with  a scrungie or clean washcloth.  5.  Apply the CHG Soap to your body ONLY FROM THE NECK DOWN.   Do not use on face/ open                           Wound or open sores. Avoid contact with eyes, ears mouth and genitals (private parts).                       Wash face,  Genitals (private parts) with your normal soap.             6.  Wash thoroughly, paying special attention to the area where your surgery  will be performed.  7.  Thoroughly rinse your body with warm water from the neck down.  8.  DO NOT shower/wash with your normal soap after using and rinsing off  the CHG Soap.                9.  Pat yourself dry with a clean towel.            10.  Wear clean pajamas.            11.  Place clean sheets on your bed the night of your first shower and do not  sleep with pets. Day of Surgery : Do not apply any lotions/deodorants the morning of surgery.  Please wear clean clothes to the hospital/surgery center.  FAILURE TO FOLLOW THESE INSTRUCTIONS MAY RESULT IN THE CANCELLATION OF YOUR SURGERY PATIENT SIGNATURE_________________________________  NURSE SIGNATURE__________________________________  ________________________________________________________________________    CLEAR LIQUID DIET   Foods Allowed                                                                     Foods Excluded  Coffee and tea, regular and decaf                             liquids that you cannot  Plain Jell-O  in any flavor                                             see through such as: Fruit ices (not with fruit pulp)                                     milk, soups, orange juice  Iced Popsicles                                    All solid food Carbonated beverages, regular and diet                                    Cranberry, grape and apple juices Sports drinks like Gatorade Lightly seasoned clear broth or consume(fat free) Sugar, honey syrup  Sample Menu  Breakfast                                Lunch                                     Supper Cranberry juice                    Beef broth                            Chicken broth Jell-O                                     Grape juice                           Apple juice Coffee or tea                        Jell-O                                      Popsicle                                                Coffee or tea                        Coffee or tea  _____________________________________________________________________

## 2016-08-27 ENCOUNTER — Encounter (HOSPITAL_COMMUNITY): Payer: Self-pay

## 2016-08-27 ENCOUNTER — Encounter (HOSPITAL_COMMUNITY)
Admission: RE | Admit: 2016-08-27 | Discharge: 2016-08-27 | Disposition: A | Discharge status: Home / Self Care | Payer: 59 | Source: Ambulatory Visit | Attending: Urology | Admitting: Urology

## 2016-08-27 DIAGNOSIS — N201 Calculus of ureter: Secondary | ICD-10-CM | POA: Diagnosis not present

## 2016-08-27 DIAGNOSIS — Z01818 Encounter for other preprocedural examination: Secondary | ICD-10-CM | POA: Diagnosis not present

## 2016-08-27 HISTORY — DX: Personal history of urinary calculi: Z87.442

## 2016-08-27 HISTORY — DX: Headache: R51

## 2016-08-27 HISTORY — DX: Headache, unspecified: R51.9

## 2016-08-27 HISTORY — DX: Hyperparathyroidism, unspecified: E21.3

## 2016-08-27 LAB — BASIC METABOLIC PANEL
Anion gap: 6 (ref 5–15)
BUN: 15 mg/dL (ref 6–20)
CO2: 28 mmol/L (ref 22–32)
CREATININE: 0.93 mg/dL (ref 0.61–1.24)
Calcium: 9.4 mg/dL (ref 8.9–10.3)
Chloride: 105 mmol/L (ref 101–111)
GFR calc non Af Amer: >60 mL/min (ref >60)
Glucose, Bld: 91 mg/dL (ref 65–99)
POTASSIUM: 3.5 mmol/L (ref 3.5–5.1)
Sodium: 139 mmol/L (ref 135–145)

## 2016-08-27 LAB — CBC
HEMATOCRIT: 45.2 % (ref 39.0–52.0)
Hemoglobin: 15.2 g/dL (ref 13.0–17.0)
MCH: 27.8 pg (ref 26.0–34.0)
MCHC: 33.6 g/dL (ref 30.0–36.0)
MCV: 82.6 fL (ref 78.0–100.0)
PLATELETS: 301 10*3/uL (ref 150–400)
RBC: 5.47 MIL/uL (ref 4.22–5.81)
RDW: 14.1 % (ref 11.5–15.5)
WBC: 8 10*3/uL (ref 4.0–10.5)

## 2016-09-01 NOTE — H&P (Signed)
Office Visit Report     08/13/2016   --------------------------------------------------------------------------------   Samuel Blankenship  MRN: [id number]  PRIMARY CARE:  Samuel Conn, MD  DOB: Oct 13, 1995, 21 year old Male  REFERRING:    SSN: [id number]  PROVIDER:  Anne Fu    LOCATION:  [facility] [phone]   --------------------------------------------------------------------------------   CC: I have kidney stones.  HPI: Samuel Blankenship is a 21 year-old male established patient who is here for renal calculi.  04/21/16 Left ureteral calculus   [name] is a pleasant 21 year old gentleman seen today after developing severe left-sided flank pain yesterday. He does have a history of kidney stones and passed one kidney stone in 2013. His flank pain became severe with associated nausea and vomiting. He denies any fever or hematuria. His pain was similar to his prior stone episode. He presented to the emergency department last evening due to the pain. A CT scan confirmed a 4 mm left proximal ureteral stone without other evidence of urolithiasis. Currently, his pain is minimal. He has been prescribed oxycodone.   05/10/2016: Patient denies passage of stone but has not had any symptoms including exacerbation of lower urinary tract symptoms or left-sided pain or discomfort since last being evaluated by Dr. Laverle Patter.     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 In an effort to avoid referral to endocrinology or General surgery for evaluation of hyperparathyroidism, patient was prescribed indapamide. 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 Approximately 10 days ago, patient began having additional severe left-sided renal colic. He was seen in an  emergency Department in [place]. CT imaging as well as ultrasound indicated an obstructing ureteral calculi on the left. Unfortunately I don't have those records at the moment. Patient required additional pain management last night and said re-presented to the emergency department in [place]. No additional imaging studies were performed. He presents today with his mother and father for reevaluation. He states possibly passing some calculi within the past 7 days but was unable to catch any stone material. Presently without symptoms but he has taken 2 Percocet within the past 12 hours. Patient also on tamsulosin. Denies any bothersome lower urinary tract symptoms. He is afebrile.    July 2018: One month f/u. He;s had two episodes of colic pain with one requiring ED visit since last OV. Currently w/o symptom. Tolerating idapamide. Denies any bothersome LUTS or interval stone passage. KUB today following a left distal calculus. Pt still resides in [place].   The problem is on the left side. He first stated noticing pain on approximately 07/09/2016. This is not his first kidney stone. He has had 2 stones prior to getting this one. He is not currently having flank pain, back pain, groin pain, nausea, vomiting, fever or chills. He has not caught a stone in his urine strainer since his symptoms began.   He has never had surgical treatment for calculi in the past.     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   Tobacco Use Assessment Completed: Used Tobacco in last 30 days? Does drink.  Drinks 2 caffeinated drinks per day.    REVIEW OF SYSTEMS:    GU Review Male:   Patient denies frequent urination, hard to postpone urination, burning/ pain with urination, get up at night to urinate, leakage of urine, stream starts and stops, trouble starting your stream, have to strain to urinate , erection problems, and penile pain.  Gastrointestinal (Upper):   Patient denies nausea, vomiting, and indigestion/ heartburn.  Gastrointestinal (Lower):   Patient denies diarrhea and constipation.  Constitutional:   Patient denies fever, night sweats, weight loss, and fatigue.  Skin:   Patient denies skin rash/ lesion and itching.  Eyes:   Patient denies blurred vision and double vision.  Ears/ Nose/ Throat:   Patient denies sore throat and sinus problems.  Hematologic/Lymphatic:   Patient denies swollen glands and easy bruising.  Cardiovascular:   Patient denies leg swelling and chest pains.  Respiratory:   Patient denies cough and shortness of breath.  Endocrine:   Patient reports excessive thirst.   Musculoskeletal:   Patient reports back pain. Patient denies joint pain.  Neurological:   Patient denies headaches and dizziness.  Psychologic:   Patient denies depression and anxiety.   VITAL SIGNS:      08/13/2016 02:05 PM  BP 128/75 mmHg  Pulse 94 /min  Temperature 98.3 F / 37 C   GU PHYSICAL EXAMINATION:    Anus and Perineum: No hemorrhoids. No anal stenosis. No rectal fissure, no anal fissure. No edema, no dimple, no perineal tenderness, no anal tenderness.  Scrotum: No lesions. No edema. No cysts. No warts.  Epididymides: Right: no spermatocele, no masses, no cysts, no tenderness, no induration, no enlargement. Left: no spermatocele, no masses, no cysts, no tenderness, no induration, no enlargement.  Testes: No tenderness, no swelling, no enlargement left testes. No tenderness, no swelling, no  enlargement right testes. Normal location left testes. Normal location right testes. No mass, no cyst, no varicocele, no hydrocele left testes. No mass, no cyst, no varicocele, no hydrocele right testes.  Urethral Meatus: Normal size. No lesion, no wart, no discharge, no polyp. Normal location.  Penis: Circumcised, no warts, no cracks. No dorsal Peyronie's plaques, no left corporal Peyronie's plaques, no right corporal Peyronie's plaques, no scarring, no warts. No balanitis, no meatal stenosis.  Prostate: 40 gram or 2+ size. Left lobe normal consistency, right lobe normal consistency. Symmetrical lobes. No prostate nodule. Left lobe no tenderness, right lobe no tenderness.  Seminal Vesicles: Nonpalpable.  Sphincter Tone: Normal sphincter. No rectal tenderness. No rectal mass.    MULTI-SYSTEM PHYSICAL EXAMINATION:    Constitutional: Well-nourished. No physical deformities. Normally developed. Good grooming.  Neck: Neck symmetrical, not swollen. Normal tracheal position.  Respiratory: No labored breathing, no use of accessory muscles.   Cardiovascular: Normal temperature, normal extremity pulses, no swelling, no varicosities.  Lymphatic: No enlargement of neck, axillae, groin.  Skin: No paleness, no jaundice, no cyanosis. No lesion, no ulcer, no rash.  Neurologic / Psychiatric: Oriented to time, oriented to place, oriented to person. No depression, no anxiety, no agitation.  Gastrointestinal: No mass, no tenderness, no rigidity, non obese  abdomen.  Eyes: Normal conjunctivae. Normal eyelids.  Ears, Nose, Mouth, and Throat: Left ear no scars, no lesions, no masses. Right ear no scars, no lesions, no masses. Nose no scars, no lesions, no masses. Normal hearing. Normal lips.  Musculoskeletal: Normal gait and station of head and neck.     PAST DATA REVIEWED:  Source Of History:  Patient, Family/Caregiver  Records Review:   Previous Doctor Records, Previous Hospital Records, Previous Patient Records   Urine Test Review:   Urinalysis, 24 Hour Urine  X-Ray Review: KUB: Reviewed Films.  C.T. Abdomen/Pelvis: Reviewed Films.     PROCEDURES:         C.T. Urogram - [id number]  Left-sided distal ureteral calculus identified at the UVJ, there was some mild to moderate hydroureteronephrosis. Small nonobstructing renal pelvis calculi seen on the right.               KUB - [id number]  A single view of the abdomen is obtained.      Bowel gas and stool pattern in the pelvic rim limits any identification of a UVJ calculus. The previously identified calculus adjacent to the left sacral wing is not clearly visible today. Stable pelvic phlebolith. No obvious calculi identified within the bladder.         Urinalysis w/Scope Dipstick Dipstick Cont'd Micro  Color: Yellow Bilirubin: Neg WBC/hpf: 0 - 5/hpf  Appearance: Clear Ketones: Neg RBC/hpf: 0 - 2/hpf  Specific Gravity: 1.020 Blood: 1+ Bacteria: Rare (0-9/hpf)  pH: 5.5 Protein: Trace Cystals: NS (Not Seen)  Glucose: Neg Urobilinogen: 0.2 Casts: NS (Not Seen)    Nitrites: Neg Trichomonas: Not Present    Leukocyte Esterase: Neg Mucous: Present      Epithelial Cells: NS (Not Seen)      Yeast: NS (Not Seen)      Sperm: Not Present    ASSESSMENT:      ICD-10 Details  1 GU:   Ureteral calculus - N20.1 Left  2   Renal calculus - N20.0   3 NON-GU:   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 Stone Protocol Without Contrast    KUB  X-Ray Notes: . History:  Hematuria: Yes/No  Patient to see MD after exam: Yes/No  Previous exam: CT / IVP/ US/ KUB/ None  When:  Where:  Diabetic: Yes/ No  BUN/ Creatinine:  Date of last BUN Creatinine:  Weight in  pounds:  Allergy- IV Contrast: Yes/ No  Conflicting diabetic meds: Yes/ No  Diabetic Meds:  Prior Authorization #: [id number]           Schedule Return Visit/Planned Activity: 1-2 Weeks - Schedule Surgery          Document Letter(s):  Created for Patient: Clinical Summary         Notes:   Larina BrasStone identified in the left distal ureter at the UVJ. Discussed ongoing medical expulsive therapy versus definitive treatment such as ureteroscopy. Patient and mother both agree that although medical expulsive therapy would be an acceptable treatment option, having multiple ER trips in the past several months has  become a financial burden to the patient himself. Unfortunately, I cannot provide them a timetable in which the stone may spontaneously pass.   I'll discuss these results with his urologist.  Ureteroscopy, the procedure as well as risks and complications were discussed with the patient. These include but are not limited to infection, injury to the urinary tract i.e. ureteral disruption/evulsion, bleeding, stent discomfort, anesthetic complications, among others.    He'll get labs today. Medications refilled. Continue tAMSULOSIN and indapamide.    * Signed by Anne FuLarry Gibson on 08/13/16 at 4:24 PM (EDT)*

## 2016-09-02 ENCOUNTER — Ambulatory Visit (HOSPITAL_COMMUNITY): Payer: 59 | Admitting: Anesthesiology

## 2016-09-02 ENCOUNTER — Encounter (HOSPITAL_COMMUNITY)
Admission: RE | Disposition: A | Discharge status: Home / Self Care | Payer: Self-pay | Source: Ambulatory Visit | Attending: Urology

## 2016-09-02 ENCOUNTER — Encounter (HOSPITAL_COMMUNITY): Payer: Self-pay | Admitting: *Deleted

## 2016-09-02 ENCOUNTER — Ambulatory Visit (HOSPITAL_COMMUNITY)
Admission: RE | Admit: 2016-09-02 | Discharge: 2016-09-02 | Disposition: A | Discharge status: Home / Self Care | Payer: 59 | Source: Ambulatory Visit | Attending: Urology | Admitting: Urology

## 2016-09-02 ENCOUNTER — Ambulatory Visit (HOSPITAL_COMMUNITY): Payer: 59

## 2016-09-02 DIAGNOSIS — R251 Tremor, unspecified: Secondary | ICD-10-CM | POA: Diagnosis not present

## 2016-09-02 DIAGNOSIS — Z87442 Personal history of urinary calculi: Secondary | ICD-10-CM | POA: Insufficient documentation

## 2016-09-02 DIAGNOSIS — F17200 Nicotine dependence, unspecified, uncomplicated: Secondary | ICD-10-CM | POA: Diagnosis not present

## 2016-09-02 DIAGNOSIS — N201 Calculus of ureter: Secondary | ICD-10-CM | POA: Insufficient documentation

## 2016-09-02 DIAGNOSIS — Z711 Person with feared health complaint in whom no diagnosis is made: Secondary | ICD-10-CM | POA: Diagnosis not present

## 2016-09-02 HISTORY — PX: CYSTOSCOPY WITH RETROGRADE PYELOGRAM, URETEROSCOPY AND STENT PLACEMENT: SHX5789

## 2016-09-02 SURGERY — CYSTOURETEROSCOPY, WITH RETROGRADE PYELOGRAM AND STENT INSERTION
Anesthesia: General | Laterality: Left

## 2016-09-02 MED ORDER — PROPOFOL 10 MG/ML IV BOLUS
INTRAVENOUS | Status: DC | PRN
  Administered 2016-09-02: 200 mg via INTRAVENOUS

## 2016-09-02 MED ORDER — SODIUM CHLORIDE 0.9 % IV SOLN
INTRAVENOUS | Status: DC | PRN
  Administered 2016-09-02: 5 mL

## 2016-09-02 MED ORDER — DEXAMETHASONE SODIUM PHOSPHATE 10 MG/ML IJ SOLN
INTRAMUSCULAR | Status: AC
  Filled 2016-09-02: qty 1

## 2016-09-02 MED ORDER — LACTATED RINGERS IV SOLN: INTRAVENOUS | Status: DC

## 2016-09-02 MED ORDER — PROPOFOL 10 MG/ML IV BOLUS
INTRAVENOUS | Status: AC
  Filled 2016-09-02: qty 20

## 2016-09-02 MED ORDER — FENTANYL CITRATE (PF) 100 MCG/2ML IJ SOLN: 25.0000 ug | INTRAMUSCULAR | Status: DC | PRN

## 2016-09-02 MED ORDER — ONDANSETRON HCL 4 MG/2ML IJ SOLN
INTRAMUSCULAR | Status: AC
  Filled 2016-09-02: qty 2

## 2016-09-02 MED ORDER — FENTANYL CITRATE (PF) 100 MCG/2ML IJ SOLN
INTRAMUSCULAR | Status: AC
  Filled 2016-09-02: qty 2

## 2016-09-02 MED ORDER — PROMETHAZINE HCL 25 MG/ML IJ SOLN: 6.2500 mg | INTRAMUSCULAR | Status: DC | PRN

## 2016-09-02 MED ORDER — SODIUM CHLORIDE 0.9 % IR SOLN
Status: DC | PRN
  Administered 2016-09-02: 4000 mL

## 2016-09-02 MED ORDER — DEXAMETHASONE SODIUM PHOSPHATE 10 MG/ML IJ SOLN
INTRAMUSCULAR | Status: DC | PRN
  Administered 2016-09-02: 10 mg via INTRAVENOUS

## 2016-09-02 MED ORDER — LIDOCAINE HCL (CARDIAC) 20 MG/ML IV SOLN
INTRAVENOUS | Status: DC | PRN
  Administered 2016-09-02: 100 mg via INTRAVENOUS

## 2016-09-02 MED ORDER — FENTANYL CITRATE (PF) 100 MCG/2ML IJ SOLN
INTRAMUSCULAR | Status: DC | PRN
  Administered 2016-09-02 (×2): 50 ug via INTRAVENOUS

## 2016-09-02 MED ORDER — MIDAZOLAM HCL 5 MG/5ML IJ SOLN
INTRAMUSCULAR | Status: DC | PRN
  Administered 2016-09-02: 2 mg via INTRAVENOUS

## 2016-09-02 MED ORDER — ROCURONIUM BROMIDE 50 MG/5ML IV SOSY
PREFILLED_SYRINGE | INTRAVENOUS | Status: AC
  Filled 2016-09-02: qty 5

## 2016-09-02 MED ORDER — CIPROFLOXACIN IN D5W 400 MG/200ML IV SOLN
INTRAVENOUS | Status: AC
  Filled 2016-09-02: qty 200

## 2016-09-02 MED ORDER — PHENAZOPYRIDINE HCL 100 MG PO TABS: 100.0000 mg | ORAL_TABLET | Freq: Three times a day (TID) | ORAL | 0 refills | Status: AC | PRN

## 2016-09-02 MED ORDER — LIDOCAINE 2% (20 MG/ML) 5 ML SYRINGE
INTRAMUSCULAR | Status: AC
  Filled 2016-09-02: qty 5

## 2016-09-02 MED ORDER — MIDAZOLAM HCL 2 MG/2ML IJ SOLN
INTRAMUSCULAR | Status: AC
  Filled 2016-09-02: qty 2

## 2016-09-02 MED ORDER — ONDANSETRON HCL 4 MG/2ML IJ SOLN
INTRAMUSCULAR | Status: DC | PRN
  Administered 2016-09-02: 4 mg via INTRAVENOUS

## 2016-09-02 MED ORDER — LACTATED RINGERS IV SOLN
INTRAVENOUS | Status: DC
Start: 2016-09-02 — End: 2016-09-02
  Administered 2016-09-02 (×2): via INTRAVENOUS

## 2016-09-02 MED ORDER — CIPROFLOXACIN IN D5W 400 MG/200ML IV SOLN
400.0000 mg | Freq: Once | INTRAVENOUS | Status: AC
  Administered 2016-09-02: 400 mg via INTRAVENOUS

## 2016-09-02 SURGICAL SUPPLY — 18 items
BAG URO CATCHER STRL LF (MISCELLANEOUS) ×3 IMPLANT
BASKET ZERO TIP NITINOL 2.4FR (BASKET) IMPLANT
CATH INTERMIT  6FR 70CM (CATHETERS) IMPLANT
CLOTH BEACON ORANGE TIMEOUT ST (SAFETY) ×3 IMPLANT
COVER SURGICAL LIGHT HANDLE (MISCELLANEOUS) ×3 IMPLANT
FIBER LASER FLEXIVA 365 (UROLOGICAL SUPPLIES) IMPLANT
FIBER LASER TRAC TIP (UROLOGICAL SUPPLIES) IMPLANT
GLOVE BIOGEL M STRL SZ7.5 (GLOVE) ×3 IMPLANT
GOWN STRL REUS W/TWL LRG LVL3 (GOWN DISPOSABLE) ×6 IMPLANT
GUIDEWIRE ANG ZIPWIRE 038X150 (WIRE) IMPLANT
GUIDEWIRE STR DUAL SENSOR (WIRE) ×3 IMPLANT
IV NS 1000ML (IV SOLUTION) ×2
IV NS 1000ML BAXH (IV SOLUTION) ×1 IMPLANT
MANIFOLD NEPTUNE II (INSTRUMENTS) ×3 IMPLANT
PACK CYSTO (CUSTOM PROCEDURE TRAY) ×3 IMPLANT
SHEATH ACCESS URETERAL 38CM (SHEATH) IMPLANT
TUBING CONNECTING 10 (TUBING) ×2 IMPLANT
TUBING CONNECTING 10' (TUBING) ×1

## 2016-09-02 NOTE — Interval H&P Note (Signed)
History and Physical Interval Note:  09/02/2016 12:31 PM  Samuel Blankenship  has presented today for surgery, with the diagnosis of LEFT URETERAL STONE  The various methods of treatment have been discussed with the patient and family. After consideration of risks, benefits and other options for treatment, the patient has consented to  Procedure(s): CYSTOSCOPY WITH RETROGRADE PYELOGRAM, URETEROSCOPY AND STENT PLACEMENT (Left) HOLMIUM LASER APPLICATION (Left) as a surgical intervention .  The patient's history has been reviewed, patient examined, no change in status, stable for surgery.  I have reviewed the patient's chart and labs.  Questions were answered to the patient's satisfaction.     Eddy Termine,LES

## 2016-09-02 NOTE — Anesthesia Procedure Notes (Signed)
Procedure Name: LMA Insertion Date/Time: 09/02/2016 1:11 PM Performed by: Thornell MuleSTUBBLEFIELD, Sehaj Mcenroe G Pre-anesthesia Checklist: Patient identified, Emergency Drugs available, Suction available and Patient being monitored Patient Re-evaluated:Patient Re-evaluated prior to induction Oxygen Delivery Method: Circle system utilized Preoxygenation: Pre-oxygenation with 100% oxygen Induction Type: IV induction LMA: LMA inserted LMA Size: 4.0 Number of attempts: 1 Placement Confirmation: positive ETCO2 Tube secured with: Tape Dental Injury: Teeth and Oropharynx as per pre-operative assessment

## 2016-09-02 NOTE — Discharge Instructions (Addendum)
1. You may see some blood in the urine and may have some burning with urination for 48-72 hours. You also may notice that you have to urinate more frequently or urgently after your procedure which is normal.  °2. You should call should you develop an inability urinate, fever > 101, persistent nausea and vomiting that prevents you from eating or drinking to stay hydrated.  ° ° °General Anesthesia, Adult, Care After °These instructions provide you with information about caring for yourself after your procedure. Your health care provider may also give you more specific instructions. Your treatment has been planned according to current medical practices, but problems sometimes occur. Call your health care provider if you have any problems or questions after your procedure. °What can I expect after the procedure? °After the procedure, it is common to have: °· Vomiting. °· A sore throat. °· Mental slowness. ° °It is common to feel: °· Nauseous. °· Cold or shivery. °· Sleepy. °· Tired. °· Sore or achy, even in parts of your body where you did not have surgery. ° °Follow these instructions at home: °For at least 24 hours after the procedure: °· Do not: °? Participate in activities where you could fall or become injured. °? Drive. °? Use heavy machinery. °? Drink alcohol. °? Take sleeping pills or medicines that cause drowsiness. °? Make important decisions or sign legal documents. °? Take care of children on your own. °· Rest. °Eating and drinking °· If you vomit, drink water, juice, or soup when you can drink without vomiting. °· Drink enough fluid to keep your urine clear or pale yellow. °· Make sure you have little or no nausea before eating solid foods. °· Follow the diet recommended by your health care provider. °General instructions °· Have a responsible adult stay with you until you are awake and alert. °· Return to your normal activities as told by your health care provider. Ask your health care provider what  activities are safe for you. °· Take over-the-counter and prescription medicines only as told by your health care provider. °· If you smoke, do not smoke without supervision. °· Keep all follow-up visits as told by your health care provider. This is important. °Contact a health care provider if: °· You continue to have nausea or vomiting at home, and medicines are not helpful. °· You cannot drink fluids or start eating again. °· You cannot urinate after 8-12 hours. °· You develop a skin rash. °· You have fever. °· You have increasing redness at the site of your procedure. °Get help right away if: °· You have difficulty breathing. °· You have chest pain. °· You have unexpected bleeding. °· You feel that you are having a life-threatening or urgent problem. °This information is not intended to replace advice given to you by your health care provider. Make sure you discuss any questions you have with your health care provider. °Document Released: 04/26/2000 Document Revised: 06/23/2015 Document Reviewed: 01/02/2015 °Elsevier Interactive Patient Education © 2018 [employer] ° ° °

## 2016-09-02 NOTE — Op Note (Signed)
Preoperative diagnosis: Left ureteral calculus  Postoperative diagnosis: History of left ureteral calculus  Procedure:  1. Cystoscopy 2. Left ureteroscopy 3. Left retrograde pyelography with interpretation  Surgeon: Moody BruinsLester S. Mathan Darroch, Jr. M.D.  Anesthesia: General  Complications: None  Intraoperative findings: Left retrograde pyelography demonstrated no ureteral or renal collecting system filling defects and without other abnormalities.  EBL: Minimal   Indication: Samuel Blankenship is a 21 y.o. year old patient with urolithiasis. After reviewing the management options for treatment, the patient elected to proceed with the above surgical procedure(s). We have discussed the potential benefits and risks of the procedure, side effects of the proposed treatment, the likelihood of the patient achieving the goals of the procedure, and any potential problems that might occur during the procedure or recuperation. Informed consent has been obtained.  Description of procedure:  The patient was taken to the operating room and general anesthesia was induced.  The patient was placed in the dorsal lithotomy position, prepped and draped in the usual sterile fashion, and preoperative antibiotics were administered. A preoperative time-out was performed.   Cystourethroscopy was performed.  The patient's urethra was examined and was normal. The bladder was then systematically examined in its entirety. There was no evidence for any bladder tumors, stones, or other mucosal pathology.    Attention then turned to the left ureteral orifice and a ureteral catheter was used to intubate the ureteral orifice.  Omnipaque contrast was injected through the ureteral catheter and a retrograde pyelogram was performed with findings as dictated above.  Although no filling defects were identified, the patient reported not having passed his stone with recent symptoms a few days ago and CT imaging from 08/13/16 had clearly  demonstrated a distal left ureteral stone.  I therefore performed ureteroscopy.  A 0.38 sensor guidewire was then advanced up the left ureter into the renal pelvis under fluoroscopic guidance. The 6 Fr semirigid ureteroscope was then advanced into the ureter next to the guidewire and the distal portion of the ureter was examined with no stone present.  The bladder was then emptied and the procedure ended.  The patient appeared to tolerate the procedure well and without complications.  The patient was able to be awakened and transferred to the recovery unit in satisfactory condition.

## 2016-09-02 NOTE — Transfer of Care (Signed)
Immediate Anesthesia Transfer of Care Note  Patient: Sloan LeiterDavid S Philbin  Procedure(s) Performed: Procedure(s): CYSTOSCOPY WITH RETROGRADE PYELOGRAM, LEFT URETEROSCOPY (Left)  Patient Location: PACU  Anesthesia Type:General  Level of Consciousness: awake, alert  and oriented  Airway & Oxygen Therapy: Patient Spontanous Breathing and Patient connected to face mask oxygen  Post-op Assessment: Report given to RN and Post -op Vital signs reviewed and stable  Post vital signs: Reviewed and stable  Last Vitals:  Vitals:   09/02/16 1159  BP: (!) 124/50  Pulse: 64  Resp: 18  Temp: 37.2 C    Last Pain:  Vitals:   09/02/16 1208  TempSrc:   PainSc: 0-No pain      Patients Stated Pain Goal: 3 (09/02/16 1208)  Complications: No apparent anesthesia complications

## 2016-09-02 NOTE — Anesthesia Postprocedure Evaluation (Signed)
Anesthesia Post Note  Patient: Samuel LeiterDavid S Blankenship  Procedure(s) Performed: Procedure(s) (LRB): CYSTOSCOPY WITH RETROGRADE PYELOGRAM, LEFT URETEROSCOPY (Left)     Patient location during evaluation: PACU Anesthesia Type: General Level of consciousness: awake and alert Pain management: pain level controlled Vital Signs Assessment: post-procedure vital signs reviewed and stable Respiratory status: spontaneous breathing, nonlabored ventilation and respiratory function stable Cardiovascular status: blood pressure returned to baseline and stable Postop Assessment: no signs of nausea or vomiting Anesthetic complications: no    Last Vitals:  Vitals:   09/02/16 1442 09/02/16 1524  BP: (!) 130/57 (!) 122/44  Pulse: (!) 57 (!) 55  Resp: 14 16  Temp: 36.8 C (!) 36.3 C    Last Pain:  Vitals:   09/02/16 1524  TempSrc: Oral  PainSc:                  [name]

## 2016-09-02 NOTE — Anesthesia Preprocedure Evaluation (Addendum)
Anesthesia Evaluation  Patient identified by MRN, date of birth, ID band Patient awake    Reviewed: Allergy & Precautions, NPO status , Patient's Chart, lab work & pertinent test results  Airway Mallampati: II  TM Distance: >3 FB Neck ROM: Full    Dental  (+) Teeth Intact, Dental Advisory Given   Pulmonary asthma , Current Smoker,    Pulmonary exam normal breath sounds clear to auscultation       Cardiovascular negative cardio ROS Normal cardiovascular exam Rhythm:Regular Rate:Normal     Neuro/Psych  Headaches, negative psych ROS   GI/Hepatic negative GI ROS, Neg liver ROS,   Endo/Other  negative endocrine ROS  Renal/GU LEFT URETERAL STONE     Musculoskeletal negative musculoskeletal ROS (+)   Abdominal   Peds  Hematology negative hematology ROS (+)   Anesthesia Other Findings Day of surgery medications reviewed with the patient.  Reproductive/Obstetrics                             Anesthesia Physical Anesthesia Plan  ASA: II  Anesthesia Plan: General   Post-op Pain Management:    Induction: Intravenous  PONV Risk Score and Plan: 1 and Ondansetron and Dexamethasone  Airway Management Planned: LMA  Additional Equipment:   Intra-op Plan:   Post-operative Plan: Extubation in OR  Informed Consent: I have reviewed the patients History and Physical, chart, labs and discussed the procedure including the risks, benefits and alternatives for the proposed anesthesia with the patient or authorized representative who has indicated his/her understanding and acceptance.   Dental advisory given  Plan Discussed with: CRNA  Anesthesia Plan Comments: (Risks/benefits of general anesthesia discussed with patient including risk of damage to teeth, lips, gum, and tongue, nausea/vomiting, allergic reactions to medications, and the possibility of heart attack, stroke and death.  All patient  questions answered.  Patient wishes to proceed.)        Anesthesia Quick Evaluation

## 2016-09-03 ENCOUNTER — Encounter (HOSPITAL_COMMUNITY): Payer: Self-pay | Admitting: Urology

## 2016-10-06 DIAGNOSIS — N200 Calculus of kidney: Secondary | ICD-10-CM | POA: Diagnosis not present

## 2016-11-16 MED FILL — INDAPAMIDE 2.5 MG TABLET: 2.5 | 30 days supply | Qty: 30 | Fill #2
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
## 2017-09-28 MED FILL — OXYCODONE-ACETAMINOPHEN 5-3: 5-325 | 7 days supply | Qty: 30 | Fill #0

## 2017-09-28 MED FILL — TAMSULOSIN HCL 0.4 MG CAP: 0.4 | 30 days supply | Qty: 30 | Fill #0

## 2017-12-05 MED FILL — INDAPAMIDE 2.5 MG TABLET: 2.5 | 30 days supply | Qty: 30 | Fill #1

## 2018-01-16 MED FILL — INDAPAMIDE 2.5 MG TABLET: 2.5 | 30 days supply | Qty: 30 | Fill #2

## 2018-01-29 IMAGING — CT CT RENAL STONE PROTOCOL
2 of 4 series · 16 of 46 positions shown, 18 images · non-contrast
Comparison: Abdominal CT dated 12/23/2011

CLINICAL DATA: 20-year-old male

EXAM:
CT ABDOMEN AND PELVIS WITHOUT CONTRAST
TECHNIQUE: Multidetector CT imaging of the abdomen and pelvis was performed
following the standard protocol without IV contrast.

[Series 3: renal stone 5.0 · axial · 0.84mm/px · z∈[-736,-276]mm · 13 of 102 slices shown, 15 images]
[im 5/102  soft-tissue]
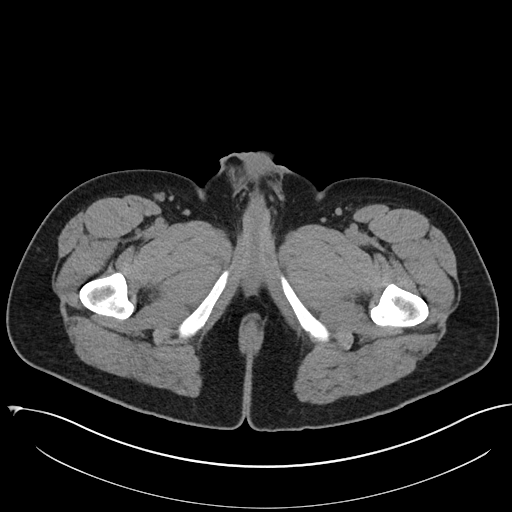
[im 5/102  bone]
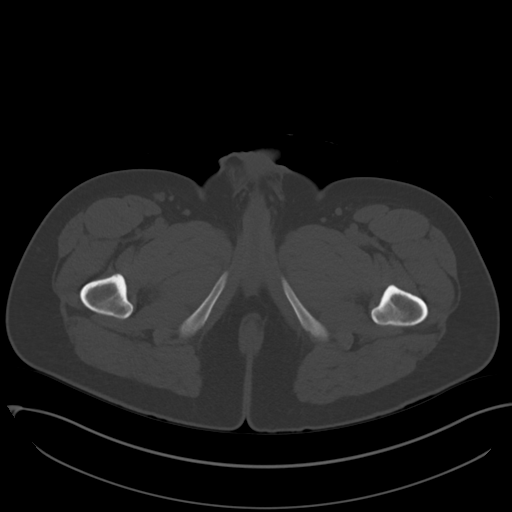
[im 13/102  soft-tissue]
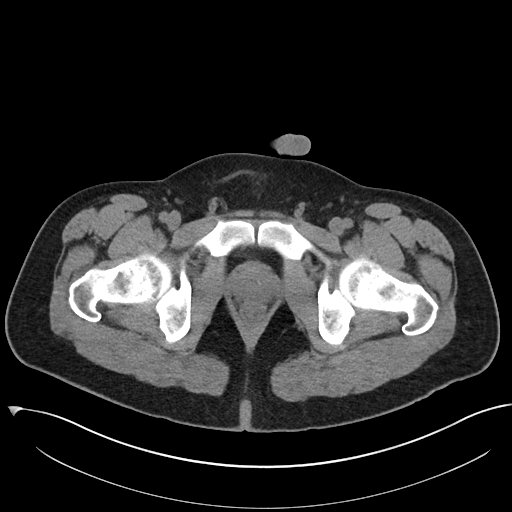
[im 21/102  soft-tissue]
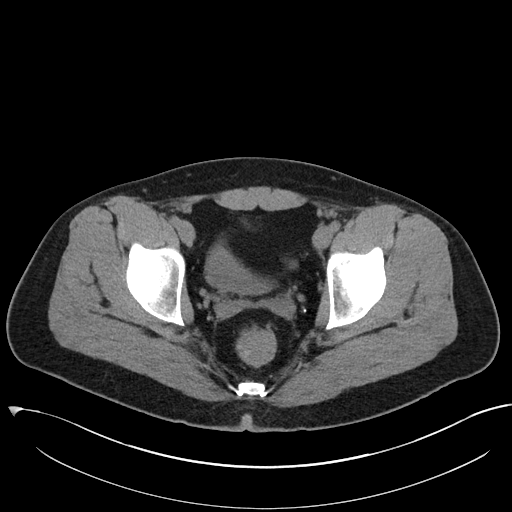
[im 29/102  soft-tissue]
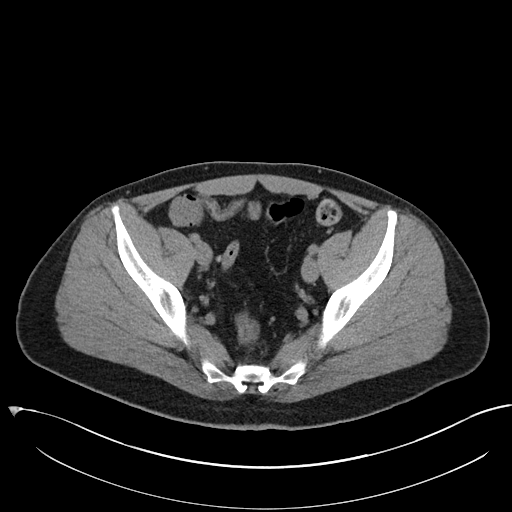
[im 37/102  soft-tissue]
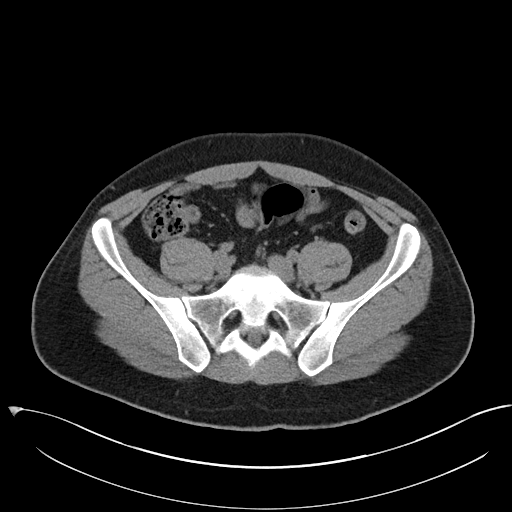
[im 45/102  soft-tissue]
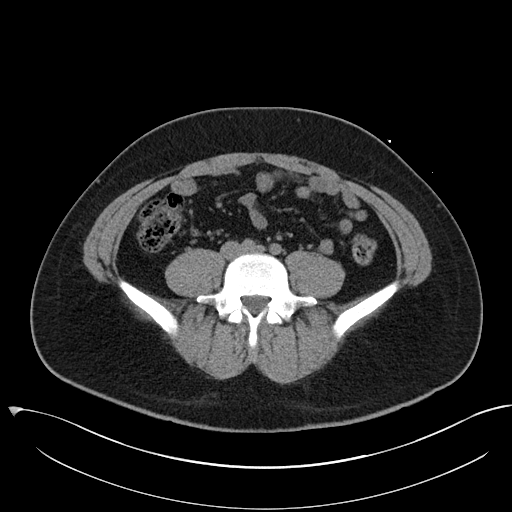
[im 53/102  soft-tissue]
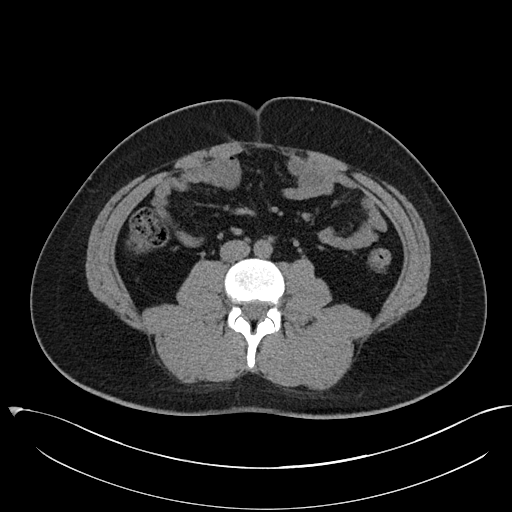
[im 57/102  soft-tissue]
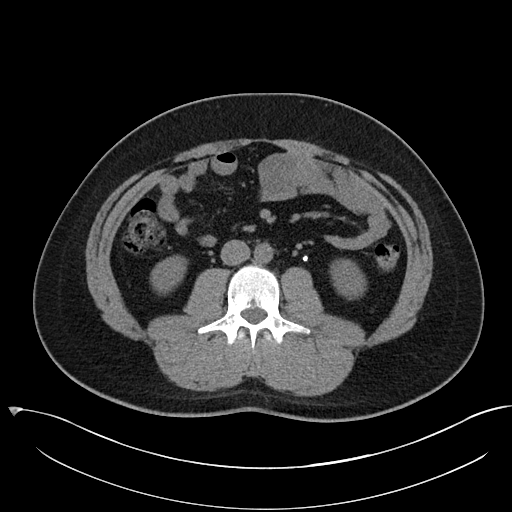
[im 65/102  soft-tissue]
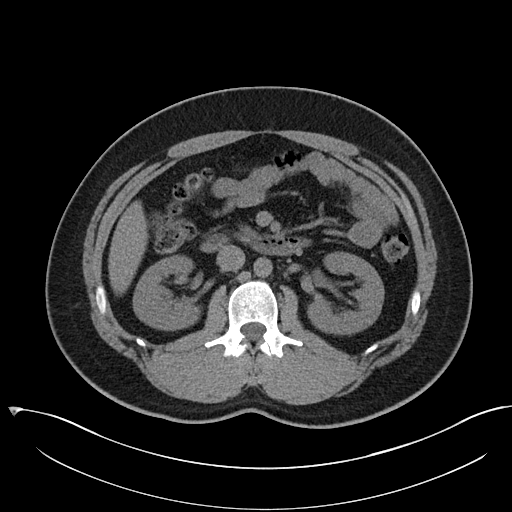
[im 65/102  bone]
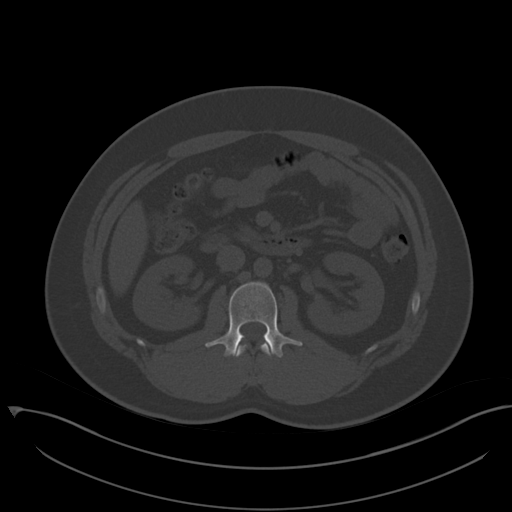
[im 73/102  soft-tissue]
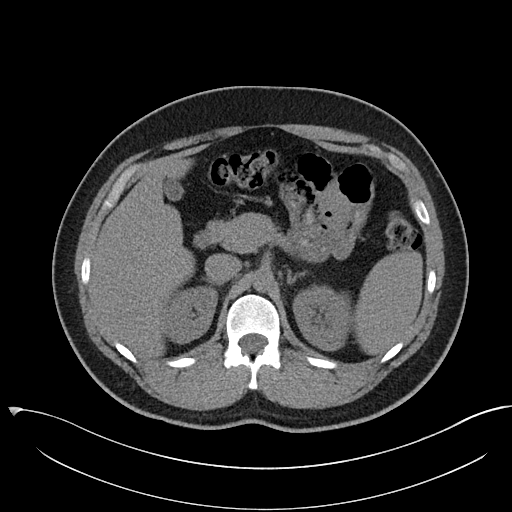
[im 81/102  soft-tissue]
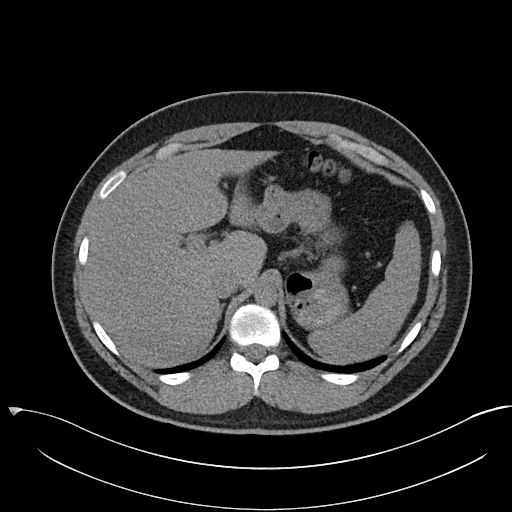
[im 89/102  soft-tissue]
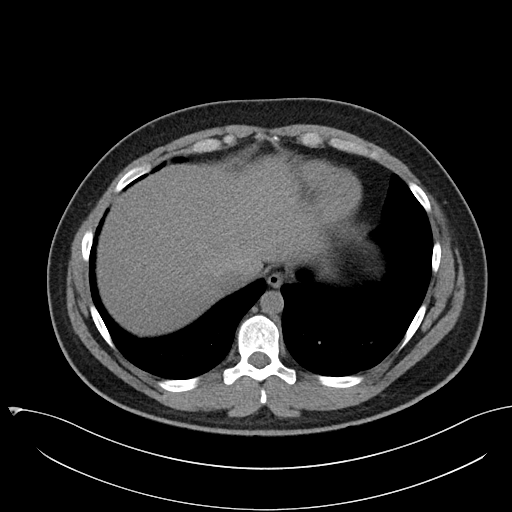
[im 97/102  soft-tissue]
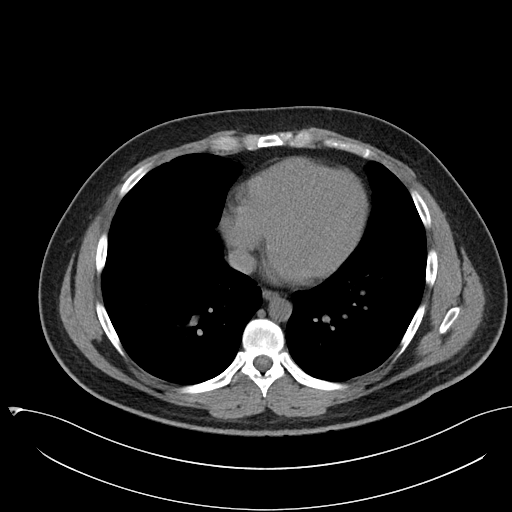

[Series 5: renal stone 3.0 cor · coronal · 0.70mm/px · 3 of 96 slices shown]
[im 32/96  soft-tissue]
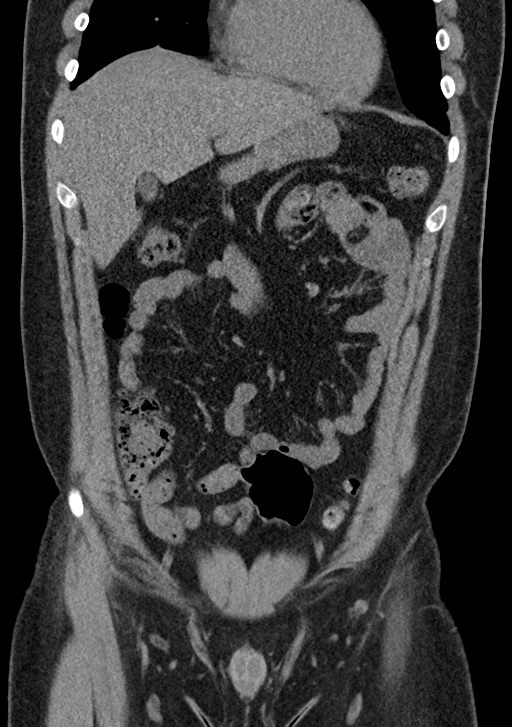
[im 43/96  soft-tissue]
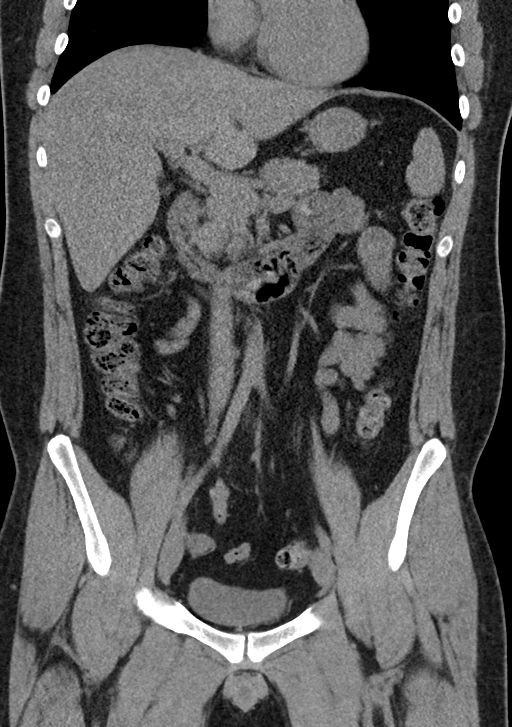
[im 53/96  soft-tissue]
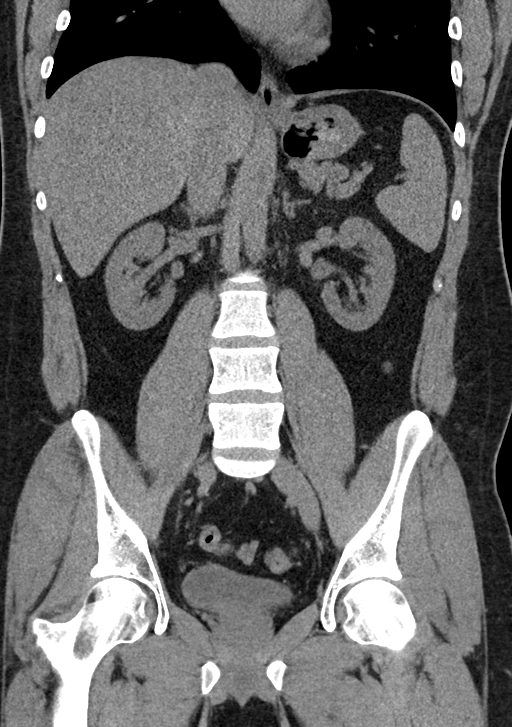

[16 of 46 positions shown; findings below may reference images not displayed]

FINDINGS: Evaluation of this exam is limited in the absence of intravenous
contrast.

Lower chest: The visualized lung bases are clear.

No intra-abdominal free air or free fluid.

Hepatobiliary: Mild fatty infiltration of the liver. No intrahepatic
biliary ductal dilatation. The gallbladder is unremarkable.

Pancreas: Unremarkable. No pancreatic ductal dilatation or
surrounding inflammatory changes.

Spleen: Normal in size without focal abnormality.

Adrenals/Urinary Tract: The adrenal glands are unremarkable.
Multiple small nonobstructing bilateral renal calculi measure up to
2 mm. There is a 4 mm left ureteropelvic junction stone. There is
mild left pelvocaliectasis. The right ureter is unremarkable. The
urinary bladder is only partially distended and grossly
unremarkable.

Stomach/Bowel: There is moderate stool throughout the colon. No
evidence of bowel obstruction or active inflammation. Normal
appendix.

Vascular/Lymphatic: The abdominal aorta and IVC are grossly
unremarkable on this noncontrast study. No portal venous gas
identified. There is no adenopathy.

Reproductive: The prostate and seminal vesicles are grossly
unremarkable.

Other: None

Musculoskeletal: No acute or significant osseous findings.
IMPRESSION: 1. A 4 mm left UPJ stone with mild left hydronephrosis. Multiple
other tiny nonobstructing bilateral renal calculi noted.
2. Mild fatty liver.
3. No bowel obstruction or active inflammation.  Normal appendix.

## 2018-03-03 MED FILL — INDAPAMIDE 2.5 MG TABLET: 2.5 | 30 days supply | Qty: 30 | Fill #3

## 2018-04-18 MED FILL — INDAPAMIDE 2.5 MG TABLET: 2.5 | 30 days supply | Qty: 30 | Fill #4 | Status: TO

## 2018-05-30 MED FILL — INDAPAMIDE 2.5 MG TAB: 2.5 | 30 days supply | Qty: 30 | Fill #0

## 2018-06-23 MED FILL — INDAPAMIDE 2.5 MG TAB: 2.5 | 30 days supply | Qty: 30 | Fill #1

## 2018-08-09 MED FILL — INDAPAMIDE 2.5 MG TAB: 2.5 | 30 days supply | Qty: 30 | Fill #2

## 2018-10-10 MED FILL — INDAPAMIDE 2.5 MG TAB: 2.5 | 30 days supply | Qty: 30 | Fill #3

## 2019-01-01 MED FILL — INDAPAMIDE 2.5 MG TAB: 2.5 | 30 days supply | Qty: 30 | Fill #0

## 2019-04-06 MED FILL — INDAPAMIDE 2.5 MG TAB: 2.5 | 90 days supply | Qty: 90 | Fill #1

## 2019-05-28 DIAGNOSIS — R7989 Other specified abnormal findings of blood chemistry: Secondary | ICD-10-CM | POA: Diagnosis not present

## 2019-05-28 DIAGNOSIS — Z0000 Encounter for general adult medical examination without abnormal findings: Secondary | ICD-10-CM | POA: Diagnosis not present

## 2019-06-04 DIAGNOSIS — Z0000 Encounter for general adult medical examination without abnormal findings: Secondary | ICD-10-CM | POA: Diagnosis not present

## 2019-06-04 DIAGNOSIS — Z1331 Encounter for screening for depression: Secondary | ICD-10-CM | POA: Diagnosis not present

## 2019-08-09 MED FILL — INDAPAMIDE 2.5 MG TAB: 2.5 | 90 days supply | Qty: 90 | Fill #2

## 2019-09-22 DIAGNOSIS — Z20822 Contact with and (suspected) exposure to covid-19: Secondary | ICD-10-CM | POA: Diagnosis not present
